# Patient Record
Sex: Female | Born: 1975 | Race: Asian | Hispanic: No | Marital: Married | Smoking: Never smoker
Health system: Southern US, Community
[De-identification: ages and names within clinical notes are randomized; demographics above are authoritative.]

## PROBLEM LIST (undated history)

## (undated) HISTORY — PX: OTHER SURGICAL HISTORY: SHX169

---

## 2006-03-03 HISTORY — PX: BREAST BIOPSY: SHX20

## 2006-03-03 HISTORY — PX: BREAST CYST ASPIRATION: SHX578

## 2017-08-04 ENCOUNTER — Ambulatory Visit: Payer: 59 | Admitting: Internal Medicine

## 2017-08-14 ENCOUNTER — Encounter: Payer: Self-pay | Admitting: Family

## 2017-08-14 ENCOUNTER — Ambulatory Visit: Payer: 59 | Admitting: Family

## 2017-08-14 VITALS — BP 118/78 | HR 74 | Temp 98.8°F | Ht 66.5 in | Wt 170.1 lb

## 2017-08-14 DIAGNOSIS — Z8639 Personal history of other endocrine, nutritional and metabolic disease: Secondary | ICD-10-CM | POA: Insufficient documentation

## 2017-08-14 DIAGNOSIS — Z0000 Encounter for general adult medical examination without abnormal findings: Secondary | ICD-10-CM

## 2017-08-14 DIAGNOSIS — R319 Hematuria, unspecified: Secondary | ICD-10-CM | POA: Insufficient documentation

## 2017-08-14 DIAGNOSIS — L989 Disorder of the skin and subcutaneous tissue, unspecified: Secondary | ICD-10-CM | POA: Insufficient documentation

## 2017-08-14 DIAGNOSIS — R5383 Other fatigue: Secondary | ICD-10-CM | POA: Insufficient documentation

## 2017-08-14 DIAGNOSIS — E611 Iron deficiency: Secondary | ICD-10-CM

## 2017-08-14 NOTE — Assessment & Plan Note (Signed)
Patient declines clinical breast exam, pelvic exam today.  She would like to establish with OB/GYN .Marland Kitchen.  Referral has been placed.  She is due for mammogram and that is been ordered.  She politely declines a diagnostic mammogram in the context of a known left cyst.  A screening  mammogram has been ordered and patient understands to schedule.  No early family history for colon cancer.

## 2017-08-14 NOTE — Assessment & Plan Note (Signed)
Pending iron studies.  

## 2017-08-14 NOTE — Assessment & Plan Note (Signed)
Does not appear infected. Referral placed.

## 2017-08-14 NOTE — Patient Instructions (Addendum)
Labs today  Today we discussed referrals, orders. OB GYN, General Surgery   I have placed these orders in the system for you.  Please be sure to give [place] a call if you have not heard from our office regarding scheduling a test or regarding referral in a timely manner.  It is very important that you let me know as soon as possible.      We placed a referral for mammogram this year. I asked that you call one the below locations and schedule this when it is convenient for you.   As discussed, I would like you to ask for 3D mammogram over the traditional 2D mammogram as new evidence suggest 3D is superior.    [place]  [place], [place] 3D mammogram if you ask*   Health Maintenance, Female Adopting a healthy lifestyle and getting preventive care can go a long way to promote health and wellness. Talk with your health care provider about what schedule of regular examinations is right for you. This is a good chance for you to check in with your provider about disease prevention and staying healthy. In between checkups, there are plenty of things you can do on your own. Experts have done a lot of research about which lifestyle changes and preventive measures are most likely to keep you healthy. Ask your health care provider for more information. Weight and diet Eat a healthy diet  Be sure to include plenty of vegetables, fruits, low-fat dairy products, and lean protein.  Do not eat a lot of foods high in solid fats, added sugars, or salt.  Get regular exercise. This is one of the most important things you can do for your health. ? Most adults should exercise for at least 150 minutes each week. The exercise should increase your heart rate and make you sweat (moderate-intensity exercise). ? Most adults should also do strengthening exercises at least twice a week. This is in addition to the moderate-intensity exercise.  Maintain a  healthy weight  Body mass index (BMI) is a measurement that can be used to identify possible weight problems. It estimates body fat based on height and weight. Your health care provider can help determine your BMI and help you achieve or maintain a healthy weight.  For females 30 years of age and older: ? A BMI below 18.5 is considered underweight. ? A BMI of 18.5 to 24.9 is normal. ? A BMI of 25 to 29.9 is considered overweight. ? A BMI of 30 and above is considered obese.  Watch levels of cholesterol and blood lipids  You should start having your blood tested for lipids and cholesterol at 40 years of age, then have this test every 5 years.  You may need to have your cholesterol levels checked more often if: ? Your lipid or cholesterol levels are high. ? You are older than 40 years of age. ? You are at high risk for heart disease.  Cancer screening Lung Cancer  Lung cancer screening is recommended for adults 40-51 years old who are at high risk for lung cancer because of a history of smoking.  A yearly low-dose CT scan of the lungs is recommended for people who: ? Currently smoke. ? Have quit within the past 15 years. ? Have at least a 30-pack-year history of smoking. A pack year is smoking an average of one pack of cigarettes a day for 1 year.  Yearly  screening should continue until it has been 15 years since you quit.  Yearly screening should stop if you develop a health problem that would prevent you from having lung cancer treatment.  Breast Cancer  Practice breast self-awareness. This means understanding how your breasts normally appear and feel.  It also means doing regular breast self-exams. Let your health care provider know about any changes, no matter how small.  If you are in your 40s or 30s, you should have a clinical breast exam (CBE) by a health care provider every 1-3 years as part of a regular health exam.  If you are 40 or older, have a CBE every year. Also  consider having a breast X-ray (mammogram) every year.  If you have a family history of breast cancer, talk to your health care provider about genetic screening.  If you are at high risk for breast cancer, talk to your health care provider about having an MRI and a mammogram every year.  Breast cancer gene (BRCA) assessment is recommended for women who have family members with BRCA-related cancers. BRCA-related cancers include: ? Breast. ? Ovarian. ? Tubal. ? Peritoneal cancers.  Results of the assessment will determine the need for genetic counseling and BRCA1 and BRCA2 testing.  Cervical Cancer Your health care provider may recommend that you be screened regularly for cancer of the pelvic organs (ovaries, uterus, and vagina). This screening involves a pelvic examination, including checking for microscopic changes to the surface of your cervix (Pap test). You may be encouraged to have this screening done every 3 years, beginning at age 40.  For women ages 40-65, health care providers may recommend pelvic exams and Pap testing every 3 years, or they may recommend the Pap and pelvic exam, combined with testing for human papilloma virus (HPV), every 5 years. Some types of HPV increase your risk of cervical cancer. Testing for HPV may also be done on women of any age with unclear Pap test results.  Other health care providers may not recommend any screening for nonpregnant women who are considered low risk for pelvic cancer and who do not have symptoms. Ask your health care provider if a screening pelvic exam is right for you.  If you have had past treatment for cervical cancer or a condition that could lead to cancer, you need Pap tests and screening for cancer for at least 40 years after your treatment. If Pap tests have been discontinued, your risk factors (such as having a new sexual partner) need to be reassessed to determine if screening should resume. Some women have medical problems that  increase the chance of getting cervical cancer. In these cases, your health care provider may recommend more frequent screening and Pap tests.  Colorectal Cancer  This type of cancer can be detected and often prevented.  Routine colorectal cancer screening usually begins at 40 years of age and continues through 40 years of age.  Your health care provider may recommend screening at an earlier age if you have risk factors for colon cancer.  Your health care provider may also recommend using home test kits to check for hidden blood in the stool.  A small camera at the end of a tube can be used to examine your colon directly (sigmoidoscopy or colonoscopy). This is done to check for the earliest forms of colorectal cancer.  Routine screening usually begins at age 45.  Direct examination of the colon should be repeated every 5-10 years through 40 years of age. However,  you may need to be screened more often if early forms of precancerous polyps or small growths are found.  Skin Cancer  Check your skin from head to toe regularly.  Tell your health care provider about any new moles or changes in moles, especially if there is a change in a mole's shape or color.  Also tell your health care provider if you have a mole that is larger than the size of a pencil eraser.  Always use sunscreen. Apply sunscreen liberally and repeatedly throughout the day.  Protect yourself by wearing long sleeves, pants, a wide-brimmed hat, and sunglasses whenever you are outside.  Heart disease, diabetes, and high blood pressure  High blood pressure causes heart disease and increases the risk of stroke. High blood pressure is more likely to develop in: ? People who have blood pressure in the high end of the normal range (130-139/85-89 mm Hg). ? People who are overweight or obese. ? People who are African American.  If you are 49-50 years of age, have your blood pressure checked every 3-5 years. If you are 62  years of age or older, have your blood pressure checked every year. You should have your blood pressure measured twice-once when you are at a hospital or clinic, and once when you are not at a hospital or clinic. Record the average of the two measurements. To check your blood pressure when you are not at a hospital or clinic, you can use: ? An automated blood pressure machine at a pharmacy. ? A home blood pressure monitor.  If you are between 61 years and 87 years old, ask your health care provider if you should take aspirin to prevent strokes.  Have regular diabetes screenings. This involves taking a blood sample to check your fasting blood sugar level. ? If you are at a normal weight and have a low risk for diabetes, have this test once every three years after 40 years of age. ? If you are overweight and have a high risk for diabetes, consider being tested at a younger age or more often. Preventing infection Hepatitis B  If you have a higher risk for hepatitis B, you should be screened for this virus. You are considered at high risk for hepatitis B if: ? You were born in a country where hepatitis B is common. Ask your health care provider which countries are considered high risk. ? Your parents were born in a high-risk country, and you have not been immunized against hepatitis B (hepatitis B vaccine). ? You have HIV or AIDS. ? You use needles to inject street drugs. ? You live with someone who has hepatitis B. ? You have had sex with someone who has hepatitis B. ? You get hemodialysis treatment. ? You take certain medicines for conditions, including cancer, organ transplantation, and autoimmune conditions.  Hepatitis C  Blood testing is recommended for: ? Everyone born from [birth year] through 1963. ? Anyone with known risk factors for hepatitis C.  Sexually transmitted infections (STIs)  You should be screened for sexually transmitted infections (STIs) including gonorrhea and chlamydia  if: ? You are sexually active and are younger than 40 years of age. ? You are older than 40 years of age and your health care provider tells you that you are at risk for this type of infection. ? Your sexual activity has changed since you were last screened and you are at an increased risk for chlamydia or gonorrhea. Ask your health care provider if you  are at risk.  If you do not have HIV, but are at risk, it may be recommended that you take a prescription medicine daily to prevent HIV infection. This is called pre-exposure prophylaxis (PrEP). You are considered at risk if: ? You are sexually active and do not regularly use condoms or know the HIV status of your partner(s). ? You take drugs by injection. ? You are sexually active with a partner who has HIV.  Talk with your health care provider about whether you are at high risk of being infected with HIV. If you choose to begin PrEP, you should first be tested for HIV. You should then be tested every 3 months for as long as you are taking PrEP. Pregnancy  If you are premenopausal and you may become pregnant, ask your health care provider about preconception counseling.  If you may become pregnant, take 400 to 800 micrograms (mcg) of folic acid every day.  If you want to prevent pregnancy, talk to your health care provider about birth control (contraception). Osteoporosis and menopause  Osteoporosis is a disease in which the bones lose minerals and strength with aging. This can result in serious bone fractures. Your risk for osteoporosis can be identified using a bone density scan.  If you are 46 years of age or older, or if you are at risk for osteoporosis and fractures, ask your health care provider if you should be screened.  Ask your health care provider whether you should take a calcium or vitamin D supplement to lower your risk for osteoporosis.  Menopause may have certain physical symptoms and risks.  Hormone replacement therapy  may reduce some of these symptoms and risks. Talk to your health care provider about whether hormone replacement therapy is right for you. Follow these instructions at home:  Schedule regular health, dental, and eye exams.  Stay current with your immunizations.  Do not use any tobacco products including cigarettes, chewing tobacco, or electronic cigarettes.  If you are pregnant, do not drink alcohol.  If you are breastfeeding, limit how much and how often you drink alcohol.  Limit alcohol intake to no more than 1 drink per day for nonpregnant women. One drink equals 12 ounces of beer, 5 ounces of wine, or 1 ounces of hard liquor.  Do not use street drugs.  Do not share needles.  Ask your health care provider for help if you need support or information about quitting drugs.  Tell your health care provider if you often feel depressed.  Tell your health care provider if you have ever been abused or do not feel safe at home. This information is not intended to replace advice given to you by your health care provider. Make sure you discuss any questions you have with your health care provider. Document Released: 09/02/2010 Document Revised: 07/26/2015 Document Reviewed: 11/21/2014 Elsevier Interactive Patient Education  [employer].

## 2017-08-14 NOTE — Progress Notes (Signed)
Subjective:    Patient ID: Nicole Hunter, female    DOB: 17-Feb-1976, 42 y.o.   MRN: [id number]  CC: Nicole PatienceZhou Pavao is a 42 y.o. female who presents today to establish care and for CPE.  HPI: Complains of neck pain for years, intermittent. Worsened with computer use of late.  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  Interested in PT. Takes tyleonol for pain with resolve.  No numbness, tingling, vision change  H/o low Iron and would like to have iron levels checked  She would also like to establish with a GYN since moving here.  She does note that her periods have gotten heavier.  No dizziness, syncope.  She also has a history of hematuria.  She is overdue to follow-up on urinalysis.  She would like this done today.    She also notes on her right pointer finger that she poked a fish oil while cooking couple weeks ago.  Has some pain there.  Only slight improvement with warm compresses.  No purulent discharge, erythema.  Feels like something may be stuck in it.  Would like a referral to see Dr. Doristine CounterBurnett, general surgeon  H/o known Left breast cyst per patient  Like a screening mammogram.  No skin concerns . No h/o skin cancer       HISTORY:  History reviewed. No pertinent past medical history. Past Surgical History:  Procedure Laterality Date  . left breast cyst Left   . right breast biopsy Right    Family History  Problem Relation Age of Onset  . Colon cancer Neg Hx     Allergies: Patient has no known allergies. Current Outpatient Medications on File Prior to Visit  Medication Sig Dispense Refill  . Cholecalciferol (VITAMIN D3) 5000 units CAPS Take by mouth daily.      No current facility-administered medications on file prior to visit.     Social History   Tobacco Use  . Smoking status: Never Smoker  . Smokeless tobacco: Never Used  Substance Use Topics  . Alcohol use: Yes  . Drug use: Never    Review of Systems  Constitutional: Negative for chills, fever and  unexpected weight change.  HENT: Negative for congestion.   Respiratory: Negative for cough.   Cardiovascular: Negative for chest pain, palpitations and leg swelling.  Gastrointestinal: Negative for abdominal pain, nausea and vomiting.  Genitourinary: Positive for hematuria (chronic, microscopic). Negative for dysuria.  Musculoskeletal: Negative for arthralgias and myalgias.  Skin: Negative for rash.  Neurological: Negative for headaches.  Hematological: Negative for adenopathy.  Psychiatric/Behavioral: Negative for confusion.      Objective:    BP 118/78 (BP Location: Left Arm, Patient Position: Sitting, Cuff Size: Normal)   Pulse 74   Temp 98.8 F (37.1 C) (Oral)   Ht 5' 6.5" (1.689 m)   Wt 170 lb 2 oz (77.2 kg)   SpO2 97%   BMI 27.05 kg/m  BP Readings from Last 3 Encounters:  08/14/17 118/78   Wt Readings from Last 3 Encounters:  08/14/17 170 lb 2 oz (77.2 kg)    Physical Exam  Constitutional: She appears well-developed and well-nourished.  Eyes: Conjunctivae are normal.  Neck: No thyroid mass and no thyromegaly present.  Cardiovascular: Normal rate, regular rhythm, normal heart sounds and normal pulses.  Pulmonary/Chest: Effort normal and breath sounds normal. She has no wheezes. She has no rhonchi. She has no rales.  Lymphadenopathy:       Head (right side): No submental,  no submandibular, no tonsillar, no preauricular, no posterior auricular and no occipital adenopathy present.       Head (left side): No submental, no submandibular, no tonsillar, no preauricular, no posterior auricular and no occipital adenopathy present.    She has no cervical adenopathy.  Neurological: She is alert.  Skin: Skin is warm and dry.  Psychiatric: She has a normal mood and affect. Her speech is normal and behavior is normal. Thought content normal.  Vitals reviewed.      Assessment & Plan:   Problem List Items Addressed This Visit      Musculoskeletal and Integument   Skin  lesion    Does not appear infected. Referral placed.       Relevant Orders   Ambulatory referral to General Surgery     Other   Routine physical examination - Primary    Patient declines clinical breast exam, pelvic exam today.  She would like to establish with OB/GYN .Marland Kitchen  Referral has been placed.  She is due for mammogram and that is been ordered.  She politely declines a diagnostic mammogram in the context of a known left cyst.  A screening  mammogram has been ordered and patient understands to schedule.  No early family history for colon cancer.      Relevant Orders   Ambulatory referral to Obstetrics / Gynecology   CBC with Differential/Platelet   Comprehensive metabolic panel   Lipid panel   TSH   VITAMIN D 25 Hydroxy (Vit-D Deficiency, Fractures)   Urine Microscopic   Hemoglobin A1c   HIV antibody   MM 3D SCREEN BREAST BILATERAL   Iron, TIBC and Ferritin Panel   History of vitamin D deficiency    Pending vitamin d      Relevant Medications   Cholecalciferol (VITAMIN D3) 5000 units CAPS   Other Relevant Orders   VITAMIN D 25 Hydroxy (Vit-D Deficiency, Fractures)   Fatigue    Pending tsh.       Iron deficiency    Pending iron studies      Relevant Orders   CBC with Differential/Platelet   Iron, TIBC and Ferritin Panel   Hematuria    Pending ua      Relevant Orders   Urine Microscopic       I am having Tynlee Bayle maintain her Vitamin D3.   No orders of the defined types were placed in this encounter.   Return precautions given.   Risks, benefits, and alternatives of the medications and treatment plan prescribed today were discussed, and patient expressed understanding.   Education regarding symptom management and diagnosis given to patient on AVS.  Continue to follow with Allegra Grana, FNP for routine health maintenance.   Nicole Patience and I agreed with plan.   Rennie Plowman, FNP

## 2017-08-14 NOTE — Assessment & Plan Note (Signed)
Pending ua

## 2017-08-14 NOTE — Assessment & Plan Note (Signed)
Pending vitamin d 

## 2017-08-14 NOTE — Assessment & Plan Note (Signed)
Pending tsh 

## 2017-08-15 LAB — CBC WITH DIFFERENTIAL/PLATELET
BASOS PCT: 0.5 %
Basophils Absolute: 40 cells/uL (ref 0–200)
EOS PCT: 4.5 %
Eosinophils Absolute: 360 cells/uL (ref 15–500)
HCT: 42.7 % (ref 35.0–45.0)
Hemoglobin: 14.7 g/dL (ref 11.7–15.5)
Lymphs Abs: 2248 cells/uL (ref 850–3900)
MCH: 29.9 pg (ref 27.0–33.0)
MCHC: 34.4 g/dL (ref 32.0–36.0)
MCV: 86.8 fL (ref 80.0–100.0)
MONOS PCT: 5.9 %
MPV: 10.8 fL (ref 7.5–12.5)
Neutro Abs: 4880 cells/uL (ref 1500–7800)
Neutrophils Relative %: 61 %
PLATELETS: 262 10*3/uL (ref 140–400)
RBC: 4.92 10*6/uL (ref 3.80–5.10)
RDW: 12 % (ref 11.0–15.0)
TOTAL LYMPHOCYTE: 28.1 %
WBC: 8 10*3/uL (ref 3.8–10.8)
WBCMIX: 472 {cells}/uL (ref 200–950)

## 2017-08-15 LAB — IRON,TIBC AND FERRITIN PANEL
%SAT: 36 % (ref 11–50)
Ferritin: 57 ng/mL (ref 10–154)
Iron: 125 ug/dL (ref 40–190)
TIBC: 344 mcg/dL (calc) (ref 250–450)

## 2017-08-15 LAB — COMPREHENSIVE METABOLIC PANEL
AG Ratio: 1.8 (calc) (ref 1.0–2.5)
ALBUMIN MSPROF: 4.6 g/dL (ref 3.6–5.1)
ALT: 10 U/L (ref 6–29)
AST: 12 U/L (ref 10–30)
Alkaline phosphatase (APISO): 48 U/L (ref 33–115)
BUN: 10 mg/dL (ref 7–25)
CHLORIDE: 104 mmol/L (ref 98–110)
CO2: 23 mmol/L (ref 20–32)
Calcium: 9.6 mg/dL (ref 8.6–10.2)
Creat: 0.77 mg/dL (ref 0.50–1.10)
GLOBULIN: 2.5 g/dL (ref 1.9–3.7)
GLUCOSE: 90 mg/dL (ref 65–99)
POTASSIUM: 4.1 mmol/L (ref 3.5–5.3)
SODIUM: 139 mmol/L (ref 135–146)
TOTAL PROTEIN: 7.1 g/dL (ref 6.1–8.1)
Total Bilirubin: 1 mg/dL (ref 0.2–1.2)

## 2017-08-15 LAB — VITAMIN D 25 HYDROXY (VIT D DEFICIENCY, FRACTURES): VIT D 25 HYDROXY: 33 ng/mL (ref 30–100)

## 2017-08-15 LAB — HEMOGLOBIN A1C
EAG (MMOL/L): 4.9 (calc)
Hgb A1c MFr Bld: 4.7 % of total Hgb (ref <5.7)
MEAN PLASMA GLUCOSE: 88 (calc)

## 2017-08-15 LAB — LIPID PANEL
CHOL/HDL RATIO: 4.1 (calc) (ref <5.0)
Cholesterol: 227 mg/dL — ABNORMAL HIGH (ref <200)
HDL: 56 mg/dL (ref >50)
LDL CHOLESTEROL (CALC): 152 mg/dL — AB
NON-HDL CHOLESTEROL (CALC): 171 mg/dL — AB (ref <130)
Triglycerides: 86 mg/dL (ref <150)

## 2017-08-15 LAB — TSH: TSH: 3.25 mIU/L

## 2017-08-15 LAB — URINALYSIS, MICROSCOPIC ONLY: Hyaline Cast: NONE SEEN /LPF

## 2017-08-15 LAB — HIV ANTIBODY (ROUTINE TESTING W REFLEX): HIV: NONREACTIVE

## 2017-08-17 ENCOUNTER — Encounter: Payer: Self-pay | Admitting: Family

## 2017-08-17 ENCOUNTER — Other Ambulatory Visit: Payer: Self-pay | Admitting: Family

## 2017-08-17 DIAGNOSIS — R319 Hematuria, unspecified: Secondary | ICD-10-CM

## 2017-08-17 NOTE — Progress Notes (Signed)
close

## 2017-08-19 NOTE — Telephone Encounter (Signed)
Address updated in system

## 2017-08-25 ENCOUNTER — Other Ambulatory Visit: Payer: 59

## 2017-09-01 ENCOUNTER — Ambulatory Visit: Payer: 59 | Admitting: Internal Medicine

## 2017-09-08 ENCOUNTER — Ambulatory Visit
Admission: RE | Admit: 2017-09-08 | Discharge: 2017-09-08 | Disposition: A | Discharge status: Home / Self Care | Payer: 59 | Source: Ambulatory Visit | Attending: Family | Admitting: Family

## 2017-09-08 DIAGNOSIS — R319 Hematuria, unspecified: Secondary | ICD-10-CM | POA: Diagnosis not present

## 2017-09-09 ENCOUNTER — Encounter: Payer: Self-pay | Admitting: Family

## 2017-09-10 ENCOUNTER — Encounter: Payer: Self-pay | Admitting: Family

## 2017-09-14 DIAGNOSIS — R319 Hematuria, unspecified: Secondary | ICD-10-CM | POA: Diagnosis not present

## 2017-09-15 ENCOUNTER — Encounter: Payer: Self-pay | Admitting: Family

## 2017-11-09 ENCOUNTER — Encounter: Payer: Self-pay | Admitting: Family

## 2017-11-17 ENCOUNTER — Other Ambulatory Visit: Payer: Self-pay | Admitting: *Deleted

## 2017-11-17 ENCOUNTER — Inpatient Hospital Stay
Admission: RE | Admit: 2017-11-17 | Discharge: 2017-11-17 | Disposition: A | Discharge status: Home / Self Care | Payer: Self-pay | Source: Ambulatory Visit | Attending: *Deleted | Admitting: *Deleted

## 2017-11-17 DIAGNOSIS — Z9289 Personal history of other medical treatment: Secondary | ICD-10-CM

## 2017-11-25 ENCOUNTER — Encounter: Payer: Self-pay | Admitting: Radiology

## 2017-11-25 ENCOUNTER — Ambulatory Visit
Admission: RE | Admit: 2017-11-25 | Discharge: 2017-11-25 | Disposition: A | Discharge status: Home / Self Care | Payer: 59 | Source: Ambulatory Visit | Attending: Family | Admitting: Family

## 2017-11-25 DIAGNOSIS — Z0000 Encounter for general adult medical examination without abnormal findings: Secondary | ICD-10-CM | POA: Insufficient documentation

## 2017-11-25 DIAGNOSIS — Z1231 Encounter for screening mammogram for malignant neoplasm of breast: Secondary | ICD-10-CM | POA: Diagnosis not present

## 2017-11-27 ENCOUNTER — Encounter: Payer: Self-pay | Admitting: Family

## 2018-01-05 DIAGNOSIS — N393 Stress incontinence (female) (male): Secondary | ICD-10-CM | POA: Diagnosis not present

## 2018-01-05 DIAGNOSIS — Z01419 Encounter for gynecological examination (general) (routine) without abnormal findings: Secondary | ICD-10-CM | POA: Diagnosis not present

## 2018-01-07 ENCOUNTER — Encounter: Payer: Self-pay | Admitting: Family

## 2018-01-11 ENCOUNTER — Other Ambulatory Visit: Payer: Self-pay | Admitting: Family

## 2018-01-11 DIAGNOSIS — J309 Allergic rhinitis, unspecified: Secondary | ICD-10-CM

## 2018-01-11 MED ORDER — AZELASTINE HCL 0.1 % NA SOLN: 1.0000 | Freq: Two times a day (BID) | NASAL | 6 refills | Status: DC

## 2018-01-19 DIAGNOSIS — H5213 Myopia, bilateral: Secondary | ICD-10-CM | POA: Diagnosis not present

## 2018-05-06 ENCOUNTER — Encounter: Payer: Self-pay | Admitting: Family

## 2018-05-07 ENCOUNTER — Telehealth: Payer: Self-pay

## 2018-05-07 NOTE — Telephone Encounter (Signed)
I called patient & advised appointment based on mychart message. I tried to get her in with Lauren & even offered appointment on Monday. Patient declined for now due to busy schedule. I explained that antibiotics do not get prescribed without being seen.

## 2018-08-27 ENCOUNTER — Encounter: Payer: 59 | Admitting: Family

## 2018-09-06 ENCOUNTER — Encounter: Payer: 59 | Admitting: Family

## 2018-09-06 ENCOUNTER — Ambulatory Visit: Payer: 59 | Admitting: Family

## 2018-09-08 ENCOUNTER — Encounter: Payer: 59 | Admitting: Family

## 2018-09-24 ENCOUNTER — Encounter: Payer: Self-pay | Admitting: Family

## 2018-09-24 ENCOUNTER — Ambulatory Visit (INDEPENDENT_AMBULATORY_CARE_PROVIDER_SITE_OTHER): Payer: 59 | Admitting: Family

## 2018-09-24 ENCOUNTER — Other Ambulatory Visit: Payer: Self-pay

## 2018-09-24 VITALS — BP 100/68 | HR 73 | Temp 98.8°F | Ht 66.25 in | Wt 172.1 lb

## 2018-09-24 DIAGNOSIS — J309 Allergic rhinitis, unspecified: Secondary | ICD-10-CM | POA: Diagnosis not present

## 2018-09-24 DIAGNOSIS — Z0000 Encounter for general adult medical examination without abnormal findings: Secondary | ICD-10-CM | POA: Diagnosis not present

## 2018-09-24 DIAGNOSIS — E611 Iron deficiency: Secondary | ICD-10-CM

## 2018-09-24 DIAGNOSIS — R319 Hematuria, unspecified: Secondary | ICD-10-CM | POA: Diagnosis not present

## 2018-09-24 MED ORDER — AZELASTINE HCL 0.1 % NA SOLN: 1.0000 | Freq: Two times a day (BID) | NASAL | 6 refills | Status: DC

## 2018-09-24 NOTE — Assessment & Plan Note (Signed)
History of. Pending iron studies.

## 2018-09-24 NOTE — Assessment & Plan Note (Addendum)
Clinical breast exam performed.  Known history of left breast cyst, on exam today could feel a palpable area as well right breast approximately 9:00.  Jointly agreed to pursue diagnostic mammogram, ultrasound.  Given instructions to patient for her to schedule.  Deferred pelvic exam as patient has a GYN she is aware that her Pap smear is due.

## 2018-09-24 NOTE — Patient Instructions (Addendum)
Always a pleasure to see you!  Today we discussed referrals, orders. Diagnostic mammogram   I have placed these orders in the system for you.  Please be sure to give [place] a call if you have not heard from our office regarding this. We should hear from [place] within ONE week with information regarding your appointment. If not, please let me know immediately.     Health Maintenance, Female Adopting a healthy lifestyle and getting preventive care are important in promoting health and wellness. Ask your health care provider about:  The right schedule for you to have regular tests and exams.  Things you can do on your own to prevent diseases and keep yourself healthy. What should I know about diet, weight, and exercise? Eat a healthy diet   Eat a diet that includes plenty of vegetables, fruits, low-fat dairy products, and lean protein.  Do not eat a lot of foods that are high in solid fats, added sugars, or sodium. Maintain a healthy weight Body mass index (BMI) is used to identify weight problems. It estimates body fat based on height and weight. Your health care provider can help determine your BMI and help you achieve or maintain a healthy weight. Get regular exercise Get regular exercise. This is one of the most important things you can do for your health. Most adults should:  Exercise for at least 150 minutes each week. The exercise should increase your heart rate and make you sweat (moderate-intensity exercise).  Do strengthening exercises at least twice a week. This is in addition to the moderate-intensity exercise.  Spend less time sitting. Even light physical activity can be beneficial. Watch cholesterol and blood lipids Have your blood tested for lipids and cholesterol at 41 years of age, then have this test every 5 years. Have your cholesterol levels checked more often if:  Your lipid or cholesterol levels are high.  You are older than 41 years of age.  You are at high risk for  heart disease. What should I know about cancer screening? Depending on your health history and family history, you may need to have cancer screening at various ages. This may include screening for:  Breast cancer.  Cervical cancer.  Colorectal cancer.  Skin cancer.  Lung cancer. What should I know about heart disease, diabetes, and high blood pressure? Blood pressure and heart disease  High blood pressure causes heart disease and increases the risk of stroke. This is more likely to develop in people who have high blood pressure readings, are of African descent, or are overweight.  Have your blood pressure checked: ? Every 3-5 years if you are 67-45 years of age. ? Every year if you are 15 years old or older. Diabetes Have regular diabetes screenings. This checks your fasting blood sugar level. Have the screening done:  Once every three years after age 64 if you are at a normal weight and have a low risk for diabetes.  More often and at a younger age if you are overweight or have a high risk for diabetes. What should I know about preventing infection? Hepatitis B If you have a higher risk for hepatitis B, you should be screened for this virus. Talk with your health care provider to find out if you are at risk for hepatitis B infection. Hepatitis C Testing is recommended for:  Everyone born from [birth year] through 1965.  Anyone with known risk factors for hepatitis C. Sexually transmitted infections (STIs)  Get screened for STIs, including gonorrhea  and chlamydia, if: ? You are sexually active and are younger than 41 years of age. ? You are older than 41 years of age and your health care provider tells you that you are at risk for this type of infection. ? Your sexual activity has changed since you were last screened, and you are at increased risk for chlamydia or gonorrhea. Ask your health care provider if you are at risk.  Ask your health care provider about whether you are at  high risk for HIV. Your health care provider may recommend a prescription medicine to help prevent HIV infection. If you choose to take medicine to prevent HIV, you should first get tested for HIV. You should then be tested every 3 months for as long as you are taking the medicine. Pregnancy  If you are about to stop having your period (premenopausal) and you may become pregnant, seek counseling before you get pregnant.  Take 400 to 800 micrograms (mcg) of folic acid every day if you become pregnant.  Ask for birth control (contraception) if you want to prevent pregnancy. Osteoporosis and menopause Osteoporosis is a disease in which the bones lose minerals and strength with aging. This can result in bone fractures. If you are 41 years old or older, or if you are at risk for osteoporosis and fractures, ask your health care provider if you should:  Be screened for bone loss.  Take a calcium or vitamin D supplement to lower your risk of fractures.  Be given hormone replacement therapy (HRT) to treat symptoms of menopause. Follow these instructions at home: Lifestyle  Do not use any products that contain nicotine or tobacco, such as cigarettes, e-cigarettes, and chewing tobacco. If you need help quitting, ask your health care provider.  Do not use street drugs.  Do not share needles.  Ask your health care provider for help if you need support or information about quitting drugs. Alcohol use  Do not drink alcohol if: ? Your health care provider tells you not to drink. ? You are pregnant, may be pregnant, or are planning to become pregnant.  If you drink alcohol: ? Limit how much you use to 0-1 drink a day. ? Limit intake if you are breastfeeding.  Be aware of how much alcohol is in your drink. In the [place] one drink equals one 12 oz bottle of beer (355 mL), one 5 oz glass of wine (148 mL), or one 1 oz glass of hard liquor (44 mL). General instructions  Schedule regular health,  dental, and eye exams.  Stay current with your vaccines.  Tell your health care provider if: ? You often feel depressed. ? You have ever been abused or do not feel safe at home. Summary  Adopting a healthy lifestyle and getting preventive care are important in promoting health and wellness.  Follow your health care provider's instructions about healthy diet, exercising, and getting tested or screened for diseases.  Follow your health care provider's instructions on monitoring your cholesterol and blood pressure. This information is not intended to replace advice given to you by your health care provider. Make sure you discuss any questions you have with your health care provider. Document Released: 09/02/2010 Document Revised: 02/10/2018 Document Reviewed: 02/10/2018 Elsevier Patient Education  2020 [employer]

## 2018-09-24 NOTE — Progress Notes (Signed)
Subjective:    Patient ID: Nicole Hunter, female    DOB: 01/26/1976, 43 y.o.   MRN: [id number]  CC: Nicole Hunter is a 43 y.o. female who presents today for physical exam.    HPI: Feels well today.   A little fatigue however not profound. Notes sleeping well. No depression, anxiety.    h/o iron deficiency  H/o hematuria  Colorectal Cancer Screening: No early family history.  Breast Cancer Screening: Mammogram due 11/2018  Cervical Cancer Screening: establish with GYN, Dr Elesa MassedWard, last year.  Continues to have periods which are heavy. Would like TSH monitored for this. She will continue to follow with Dr Elesa MassedWard.   Bone Health screening/DEXA for 65+: No increased fracture risk. Defer screening at this time. Lung Cancer Screening: Doesn't have 30 year pack year history and age > 55 years       Tetanus - utd         Labs: Screening labs today. Exercise: Gets regular exercise.  Alcohol use: occasional Smoking/tobacco use: Nonsmoker.    HISTORY:  History reviewed. No pertinent past medical history.  Past Surgical History:  Procedure Laterality Date  . BREAST CYST ASPIRATION Right 2008  . left breast cyst Left   . right breast biopsy Right    Family History  Problem Relation Age of Onset  . Colon cancer Neg Hx   . Breast cancer Neg Hx       ALLERGIES: Patient has no known allergies.  Current Outpatient Medications on File Prior to Visit  Medication Sig Dispense Refill  . Cholecalciferol (VITAMIN D3) 5000 units CAPS Take by mouth daily.      No current facility-administered medications on file prior to visit.     Social History   Tobacco Use  . Smoking status: Never Smoker  . Smokeless tobacco: Never Used  Substance Use Topics  . Alcohol use: Yes  . Drug use: Never    Review of Systems  Constitutional: Negative for chills, fever and unexpected weight change.  HENT: Negative for congestion.   Respiratory: Negative for cough.   Cardiovascular: Negative for chest pain,  palpitations and leg swelling.  Gastrointestinal: Negative for nausea and vomiting.  Musculoskeletal: Negative for arthralgias and myalgias.  Skin: Negative for rash.  Neurological: Negative for headaches.  Hematological: Negative for adenopathy.  Psychiatric/Behavioral: Negative for confusion.      Objective:    BP 100/68   Pulse 73   Temp 98.8 F (37.1 C)   Ht 5' 6.25" (1.683 m)   Wt 172 lb 1.9 oz (78.1 kg)   SpO2 95%   BMI 27.57 kg/m   BP Readings from Last 3 Encounters:  09/24/18 100/68  08/14/17 118/78   Wt Readings from Last 3 Encounters:  09/24/18 172 lb 1.9 oz (78.1 kg)  08/14/17 170 lb 2 oz (77.2 kg)    Physical Exam Vitals signs reviewed.  Constitutional:      Appearance: She is well-developed.  Eyes:     Conjunctiva/sclera: Conjunctivae normal.  Neck:     Thyroid: No thyroid mass or thyromegaly.  Cardiovascular:     Rate and Rhythm: Normal rate and regular rhythm.     Pulses: Normal pulses.     Heart sounds: Normal heart sounds.  Pulmonary:     Effort: Pulmonary effort is normal.     Breath sounds: Normal breath sounds. No wheezing, rhonchi or rales.  Chest:     Breasts: Breasts are symmetrical.        Right:  Mass present. No inverted nipple, nipple discharge, skin change or tenderness.        Left: Mass present. No inverted nipple, nipple discharge, skin change or tenderness.       Comments: Non  Tender, non fluctuant  left breast mass 6 oclock;  Non  Tender, non fluctuant right breast mass 9 oclock Lymphadenopathy:     Head:     Right side of head: No submental, submandibular, tonsillar, preauricular, posterior auricular or occipital adenopathy.     Left side of head: No submental, submandibular, tonsillar, preauricular, posterior auricular or occipital adenopathy.     Cervical: No cervical adenopathy.     Right cervical: No superficial, deep or posterior cervical adenopathy.    Left cervical: No superficial, deep or posterior cervical  adenopathy.  Skin:    General: Skin is warm and dry.  Neurological:     Mental Status: She is alert.  Psychiatric:        Speech: Speech normal.        Behavior: Behavior normal.        Thought Content: Thought content normal.        Assessment & Plan:   Problem List Items Addressed This Visit      Other   Routine physical examination - Primary    Clinical breast exam performed.  Known history of left breast cyst, on exam today could feel a palpable area as well right breast approximately 9:00.  Jointly agreed to pursue diagnostic mammogram, ultrasound.  Given instructions to patient for her to schedule.  Deferred pelvic exam as patient has a GYN she is aware that her Pap smear is due.        Relevant Orders   TSH   CBC with Differential/Platelet   Comprehensive metabolic panel   Hemoglobin A1c   Lipid panel   VITAMIN D 25 Hydroxy (Vit-D Deficiency, Fractures)   MM DIAG BREAST TOMO BILATERAL   US BREAST LTD UNI LEFT INC AXILLA   US BREAST LTD UNI RIGHT INC AXILLA   Iron deficiency    History of. Pending iron studies.       Relevant Orders   Ferritin   Iron, TIBC and Ferritin Panel   Hematuria    Other Visit Diagnoses    Allergic rhinitis, unspecified seasonality, unspecified trigger       Relevant Medications   azelastine (ASTELIN) 0.1 % nasal spray       I am having Nicole Hunter maintain her Vitamin D3 and azelastine.   Meds ordered this encounter  Medications  . azelastine (ASTELIN) 0.1 % nasal spray    Sig: Place 1 spray into both nostrils 2 (two) times daily. Use in each nostril as directed    Dispense:  30 mL    Refill:  6    Order Specific Question:   Supervising Provider    Answer:   Crecencio Mc [[id number]]    Return precautions given.   Risks, benefits, and alternatives of the medications and treatment plan prescribed today were discussed, and patient expressed understanding.   Education regarding symptom management and diagnosis given to patient  on AVS.   Continue to follow with Burnard Hawthorne, FNP for routine health maintenance.   Nicole Hunter and I agreed with plan.   Mable Paris, FNP

## 2018-09-25 LAB — CBC WITH DIFFERENTIAL/PLATELET
Absolute Monocytes: 529 cells/uL (ref 200–950)
Basophils Absolute: 34 cells/uL (ref 0–200)
Basophils Relative: 0.4 %
Eosinophils Absolute: 176 cells/uL (ref 15–500)
Eosinophils Relative: 2.1 %
HCT: 41.4 % (ref 35.0–45.0)
Hemoglobin: 13.9 g/dL (ref 11.7–15.5)
Lymphs Abs: 1865 cells/uL (ref 850–3900)
MCH: 29.6 pg (ref 27.0–33.0)
MCHC: 33.6 g/dL (ref 32.0–36.0)
MCV: 88.3 fL (ref 80.0–100.0)
MPV: 10.9 fL (ref 7.5–12.5)
Monocytes Relative: 6.3 %
Neutro Abs: 5796 cells/uL (ref 1500–7800)
Neutrophils Relative %: 69 %
Platelets: 250 10*3/uL (ref 140–400)
RBC: 4.69 10*6/uL (ref 3.80–5.10)
RDW: 12.1 % (ref 11.0–15.0)
Total Lymphocyte: 22.2 %
WBC: 8.4 10*3/uL (ref 3.8–10.8)

## 2018-09-25 LAB — COMPREHENSIVE METABOLIC PANEL
AG Ratio: 1.9 (calc) (ref 1.0–2.5)
ALT: 9 U/L (ref 6–29)
AST: 10 U/L (ref 10–30)
Albumin: 4.4 g/dL (ref 3.6–5.1)
Alkaline phosphatase (APISO): 40 U/L (ref 31–125)
BUN: 15 mg/dL (ref 7–25)
CO2: 24 mmol/L (ref 20–32)
Calcium: 9.1 mg/dL (ref 8.6–10.2)
Chloride: 106 mmol/L (ref 98–110)
Creat: 0.89 mg/dL (ref 0.50–1.10)
Globulin: 2.3 g/dL (calc) (ref 1.9–3.7)
Glucose, Bld: 82 mg/dL (ref 65–99)
Potassium: 3.8 mmol/L (ref 3.5–5.3)
Sodium: 138 mmol/L (ref 135–146)
Total Bilirubin: 0.7 mg/dL (ref 0.2–1.2)
Total Protein: 6.7 g/dL (ref 6.1–8.1)

## 2018-09-25 LAB — LIPID PANEL
Cholesterol: 213 mg/dL — ABNORMAL HIGH (ref <200)
HDL: 55 mg/dL (ref >=50)
LDL Cholesterol (Calc): 138 mg/dL (calc) — ABNORMAL HIGH
Non-HDL Cholesterol (Calc): 158 mg/dL (calc) — ABNORMAL HIGH (ref <130)
Total CHOL/HDL Ratio: 3.9 (calc) (ref <5.0)
Triglycerides: 101 mg/dL (ref <150)

## 2018-09-25 LAB — HEMOGLOBIN A1C
Hgb A1c MFr Bld: 4.8 % of total Hgb (ref <5.7)
Mean Plasma Glucose: 91 (calc)
eAG (mmol/L): 5 (calc)

## 2018-09-25 LAB — TSH: TSH: 2.43 mIU/L

## 2018-09-25 LAB — SPECIMEN COMPROMISED

## 2018-09-25 LAB — VITAMIN D 25 HYDROXY (VIT D DEFICIENCY, FRACTURES): Vit D, 25-Hydroxy: 21 ng/mL — ABNORMAL LOW (ref 30–100)

## 2018-09-25 LAB — IRON,TIBC AND FERRITIN PANEL
%SAT: 35 % (calc) (ref 16–45)
Ferritin: 43 ng/mL (ref 16–154)
Iron: 119 ug/dL (ref 40–190)
TIBC: 342 mcg/dL (calc) (ref 250–450)

## 2018-10-07 ENCOUNTER — Ambulatory Visit
Admission: RE | Admit: 2018-10-07 | Discharge: 2018-10-07 | Disposition: A | Discharge status: Home / Self Care | Payer: 59 | Source: Ambulatory Visit | Attending: Family | Admitting: Family

## 2018-10-07 DIAGNOSIS — N6315 Unspecified lump in the right breast, overlapping quadrants: Secondary | ICD-10-CM | POA: Diagnosis not present

## 2018-10-07 DIAGNOSIS — Z0000 Encounter for general adult medical examination without abnormal findings: Secondary | ICD-10-CM

## 2018-10-07 DIAGNOSIS — N6313 Unspecified lump in the right breast, lower outer quadrant: Secondary | ICD-10-CM | POA: Diagnosis not present

## 2018-10-07 DIAGNOSIS — N6311 Unspecified lump in the right breast, upper outer quadrant: Secondary | ICD-10-CM | POA: Diagnosis not present

## 2018-10-07 DIAGNOSIS — N6002 Solitary cyst of left breast: Secondary | ICD-10-CM | POA: Insufficient documentation

## 2018-10-07 DIAGNOSIS — R922 Inconclusive mammogram: Secondary | ICD-10-CM | POA: Diagnosis not present

## 2018-10-08 ENCOUNTER — Other Ambulatory Visit: Payer: Self-pay | Admitting: Family

## 2018-10-08 DIAGNOSIS — N6310 Unspecified lump in the right breast, unspecified quadrant: Secondary | ICD-10-CM

## 2018-10-14 DIAGNOSIS — R809 Proteinuria, unspecified: Secondary | ICD-10-CM | POA: Diagnosis not present

## 2018-10-14 DIAGNOSIS — R3129 Other microscopic hematuria: Secondary | ICD-10-CM | POA: Diagnosis not present

## 2018-10-18 DIAGNOSIS — R3129 Other microscopic hematuria: Secondary | ICD-10-CM | POA: Diagnosis not present

## 2018-10-18 DIAGNOSIS — R809 Proteinuria, unspecified: Secondary | ICD-10-CM | POA: Diagnosis not present

## 2019-04-14 ENCOUNTER — Ambulatory Visit
Admission: RE | Admit: 2019-04-14 | Discharge: 2019-04-14 | Disposition: A | Discharge status: Home / Self Care | Payer: 59 | Source: Ambulatory Visit | Attending: Family | Admitting: Family

## 2019-04-14 DIAGNOSIS — N6311 Unspecified lump in the right breast, upper outer quadrant: Secondary | ICD-10-CM | POA: Diagnosis not present

## 2019-04-14 DIAGNOSIS — N6310 Unspecified lump in the right breast, unspecified quadrant: Secondary | ICD-10-CM | POA: Diagnosis not present

## 2019-04-14 DIAGNOSIS — N6313 Unspecified lump in the right breast, lower outer quadrant: Secondary | ICD-10-CM | POA: Diagnosis not present

## 2019-04-16 ENCOUNTER — Other Ambulatory Visit: Payer: Self-pay | Admitting: Family

## 2019-04-16 DIAGNOSIS — R928 Other abnormal and inconclusive findings on diagnostic imaging of breast: Secondary | ICD-10-CM

## 2019-04-20 ENCOUNTER — Other Ambulatory Visit: Payer: Self-pay | Admitting: Nephrology

## 2019-04-20 DIAGNOSIS — R319 Hematuria, unspecified: Secondary | ICD-10-CM

## 2019-04-28 ENCOUNTER — Other Ambulatory Visit: Payer: Self-pay | Admitting: Internal Medicine

## 2019-04-28 ENCOUNTER — Other Ambulatory Visit
Admission: RE | Admit: 2019-04-28 | Discharge: 2019-04-28 | Disposition: A | Discharge status: Home / Self Care | Payer: 59 | Source: Ambulatory Visit | Attending: Nephrology | Admitting: Nephrology

## 2019-04-28 DIAGNOSIS — R319 Hematuria, unspecified: Secondary | ICD-10-CM | POA: Diagnosis not present

## 2019-04-28 LAB — RENAL FUNCTION PANEL
Albumin: 4.7 g/dL (ref 3.5–5.0)
Anion gap: 9 (ref 5–15)
BUN: 10 mg/dL (ref 6–20)
CO2: 21 mmol/L — ABNORMAL LOW (ref 22–32)
Calcium: 9.3 mg/dL (ref 8.9–10.3)
Chloride: 106 mmol/L (ref 98–111)
Creatinine, Ser: 0.7 mg/dL (ref 0.44–1.00)
GFR calc Af Amer: >60 mL/min (ref >60)
GFR calc non Af Amer: >60 mL/min (ref >60)
Glucose, Bld: 98 mg/dL (ref 70–99)
Phosphorus: 3 mg/dL (ref 2.5–4.6)
Potassium: 4.2 mmol/L (ref 3.5–5.1)
Sodium: 136 mmol/L (ref 135–145)

## 2019-04-28 LAB — MAGNESIUM: Magnesium: 2.1 mg/dL (ref 1.7–2.4)

## 2019-10-03 ENCOUNTER — Other Ambulatory Visit: Payer: Self-pay

## 2019-10-03 ENCOUNTER — Ambulatory Visit (INDEPENDENT_AMBULATORY_CARE_PROVIDER_SITE_OTHER): Payer: 59 | Admitting: Family

## 2019-10-03 ENCOUNTER — Encounter: Payer: Self-pay | Admitting: Family

## 2019-10-03 VITALS — BP 116/82 | HR 71 | Temp 97.8°F | Ht 66.26 in | Wt 177.8 lb

## 2019-10-03 DIAGNOSIS — E611 Iron deficiency: Secondary | ICD-10-CM

## 2019-10-03 DIAGNOSIS — Z8639 Personal history of other endocrine, nutritional and metabolic disease: Secondary | ICD-10-CM | POA: Diagnosis not present

## 2019-10-03 DIAGNOSIS — R319 Hematuria, unspecified: Secondary | ICD-10-CM

## 2019-10-03 DIAGNOSIS — Z136 Encounter for screening for cardiovascular disorders: Secondary | ICD-10-CM | POA: Diagnosis not present

## 2019-10-03 DIAGNOSIS — Z0000 Encounter for general adult medical examination without abnormal findings: Secondary | ICD-10-CM

## 2019-10-03 DIAGNOSIS — Z13220 Encounter for screening for lipoid disorders: Secondary | ICD-10-CM

## 2019-10-03 NOTE — Patient Instructions (Signed)
( declines AVS printed)  Always nice to see you!   Health Maintenance, Female Adopting a healthy lifestyle and getting preventive care are important in promoting health and wellness. Ask your health care provider about:  The right schedule for you to have regular tests and exams.  Things you can do on your own to prevent diseases and keep yourself healthy. What should I know about diet, weight, and exercise? Eat a healthy diet   Eat a diet that includes plenty of vegetables, fruits, low-fat dairy products, and lean protein.  Do not eat a lot of foods that are high in solid fats, added sugars, or sodium. Maintain a healthy weight Body mass index (BMI) is used to identify weight problems. It estimates body fat based on height and weight. Your health care provider can help determine your BMI and help you achieve or maintain a healthy weight. Get regular exercise Get regular exercise. This is one of the most important things you can do for your health. Most adults should:  Exercise for at least 150 minutes each week. The exercise should increase your heart rate and make you sweat (moderate-intensity exercise).  Do strengthening exercises at least twice a week. This is in addition to the moderate-intensity exercise.  Spend less time sitting. Even light physical activity can be beneficial. Watch cholesterol and blood lipids Have your blood tested for lipids and cholesterol at 42 years of age, then have this test every 5 years. Have your cholesterol levels checked more often if:  Your lipid or cholesterol levels are high.  You are older than 42 years of age.  You are at high risk for heart disease. What should I know about cancer screening? Depending on your health history and family history, you may need to have cancer screening at various ages. This may include screening for:  Breast cancer.  Cervical cancer.  Colorectal cancer.  Skin cancer.  Lung cancer. What should I  know about heart disease, diabetes, and high blood pressure? Blood pressure and heart disease  High blood pressure causes heart disease and increases the risk of stroke. This is more likely to develop in people who have high blood pressure readings, are of African descent, or are overweight.  Have your blood pressure checked: ? Every 3-5 years if you are 84-42 years of age. ? Every year if you are 73 years old or older. Diabetes Have regular diabetes screenings. This checks your fasting blood sugar level. Have the screening done:  Once every three years after age 42 if you are at a normal weight and have a low risk for diabetes.  More often and at a younger age if you are overweight or have a high risk for diabetes. What should I know about preventing infection? Hepatitis B If you have a higher risk for hepatitis B, you should be screened for this virus. Talk with your health care provider to find out if you are at risk for hepatitis B infection. Hepatitis C Testing is recommended for:  Everyone born from [birth year] through 1965.  Anyone with known risk factors for hepatitis C. Sexually transmitted infections (STIs)  Get screened for STIs, including gonorrhea and chlamydia, if: ? You are sexually active and are younger than 42 years of age. ? You are older than 42 years of age and your health care provider tells you that you are at risk for this type of infection. ? Your sexual activity has changed since you were last screened, and you are at  increased risk for chlamydia or gonorrhea. Ask your health care provider if you are at risk.  Ask your health care provider about whether you are at high risk for HIV. Your health care provider may recommend a prescription medicine to help prevent HIV infection. If you choose to take medicine to prevent HIV, you should first get tested for HIV. You should then be tested every 3 months for as long as you are taking the medicine. Pregnancy  If you are  about to stop having your period (premenopausal) and you may become pregnant, seek counseling before you get pregnant.  Take 400 to 800 micrograms (mcg) of folic acid every day if you become pregnant.  Ask for birth control (contraception) if you want to prevent pregnancy. Osteoporosis and menopause Osteoporosis is a disease in which the bones lose minerals and strength with aging. This can result in bone fractures. If you are 42 years old or older, or if you are at risk for osteoporosis and fractures, ask your health care provider if you should:  Be screened for bone loss.  Take a calcium or vitamin D supplement to lower your risk of fractures.  Be given hormone replacement therapy (HRT) to treat symptoms of menopause. Follow these instructions at home: Lifestyle  Do not use any products that contain nicotine or tobacco, such as cigarettes, e-cigarettes, and chewing tobacco. If you need help quitting, ask your health care provider.  Do not use street drugs.  Do not share needles.  Ask your health care provider for help if you need support or information about quitting drugs. Alcohol use  Do not drink alcohol if: ? Your health care provider tells you not to drink. ? You are pregnant, may be pregnant, or are planning to become pregnant.  If you drink alcohol: ? Limit how much you use to 0-1 drink a day. ? Limit intake if you are breastfeeding.  Be aware of how much alcohol is in your drink. In the [place] one drink equals one 12 oz bottle of beer (355 mL), one 5 oz glass of wine (148 mL), or one 1 oz glass of hard liquor (44 mL). General instructions  Schedule regular health, dental, and eye exams.  Stay current with your vaccines.  Tell your health care provider if: ? You often feel depressed. ? You have ever been abused or do not feel safe at home. Summary  Adopting a healthy lifestyle and getting preventive care are important in promoting health and wellness.  Follow  your health care provider's instructions about healthy diet, exercising, and getting tested or screened for diseases.  Follow your health care provider's instructions on monitoring your cholesterol and blood pressure. This information is not intended to replace advice given to you by your health care provider. Make sure you discuss any questions you have with your health care provider. Document Revised: 02/10/2018 Document Reviewed: 02/10/2018 Elsevier Patient Education  2020 [employer].

## 2019-10-03 NOTE — Progress Notes (Signed)
Subjective:    Patient ID: Nicole Hunter, female    DOB: 1976/03/03, 44 y.o.   MRN: [id number]  CC: Nicole Hunter is a 44 y.o. female who presents today for physical exam.    HPI: Feels well today No complaints No depression.  Children are doing well.  Reports 5 lb weight gain over past year, attributes to snacking at night when charting.     H/o iron deficiency  History of hematuria, follows with Dr. Thedore Mins Colorectal Cancer Screening: No early family history.  Breast Cancer Screening: Diagnostic Mammogram & right [place] scheduled. History of right breast biopsy.  Cervical Cancer Screening: Due. She declines today. follows with GYN, Dr Elesa Massed.          Tetanus - utd         Hepatitis C screening - Candidate for, consents  Labs: Screening labs today. Exercise: Gets regular exercise, walking. Using standing desk at home. Alcohol use: Occassional Smoking/tobacco use: Nonsmoker.    HISTORY:  History reviewed. No pertinent past medical history.  Past Surgical History:  Procedure Laterality Date  . BREAST BIOPSY Right 2008  . BREAST CYST ASPIRATION Right 2008  . left breast cyst Left   . right breast biopsy Right    Family History  Problem Relation Age of Onset  . Colon cancer Neg Hx   . Breast cancer Neg Hx       ALLERGIES: Patient has no known allergies.  Current Outpatient Medications on File Prior to Visit  Medication Sig Dispense Refill  . azelastine (ASTELIN) 0.1 % nasal spray Place 1 spray into both nostrils 2 (two) times daily. Use in each nostril as directed 30 mL 6  . Cholecalciferol (VITAMIN D3) 5000 units CAPS Take by mouth daily.     Marland Kitchen omeprazole (PRILOSEC) 20 MG capsule Take by mouth.     No current facility-administered medications on file prior to visit.    Social History   Tobacco Use  . Smoking status: Never Smoker  . Smokeless tobacco: Never Used  Substance Use Topics  . Alcohol use: Yes  . Drug use: Never    Review of Systems  Constitutional:  Negative for chills, fever and unexpected weight change.  HENT: Negative for congestion.   Respiratory: Negative for cough.   Cardiovascular: Negative for chest pain, palpitations and leg swelling.  Gastrointestinal: Negative for nausea and vomiting.  Musculoskeletal: Negative for arthralgias and myalgias.  Skin: Negative for rash.  Neurological: Negative for headaches.  Hematological: Negative for adenopathy.  Psychiatric/Behavioral: Negative for confusion.      Objective:    BP 116/82 (BP Location: Left Arm, Patient Position: Sitting)   Pulse 71   Temp 97.8 F (36.6 C)   Ht 5' 6.26" (1.683 m)   Wt 177 lb 12.8 oz (80.6 kg)   SpO2 97%   BMI 28.47 kg/m   BP Readings from Last 3 Encounters:  10/03/19 116/82  09/24/18 100/68  08/14/17 118/78   Wt Readings from Last 3 Encounters:  10/03/19 177 lb 12.8 oz (80.6 kg)  09/24/18 172 lb 1.9 oz (78.1 kg)  08/14/17 170 lb 2 oz (77.2 kg)    Physical Exam Vitals reviewed.  Constitutional:      Appearance: She is well-developed.  Eyes:     Conjunctiva/sclera: Conjunctivae normal.  Neck:     Thyroid: No thyroid mass or thyromegaly.  Cardiovascular:     Rate and Rhythm: Normal rate and regular rhythm.     Pulses: Normal pulses.  Heart sounds: Normal heart sounds.  Pulmonary:     Effort: Pulmonary effort is normal.     Breath sounds: Normal breath sounds. No wheezing, rhonchi or rales.  Chest:     Breasts: Breasts are symmetrical.        Right: No inverted nipple, mass, nipple discharge, skin change or tenderness.        Left: No inverted nipple, mass, nipple discharge, skin change or tenderness.  Lymphadenopathy:     Head:     Right side of head: No submental, submandibular, tonsillar, preauricular, posterior auricular or occipital adenopathy.     Left side of head: No submental, submandibular, tonsillar, preauricular, posterior auricular or occipital adenopathy.     Cervical: No cervical adenopathy.     Right cervical:  No superficial, deep or posterior cervical adenopathy.    Left cervical: No superficial, deep or posterior cervical adenopathy.  Skin:    General: Skin is warm and dry.  Neurological:     Mental Status: She is alert.  Psychiatric:        Speech: Speech normal.        Behavior: Behavior normal.        Thought Content: Thought content normal.        Assessment & Plan:   Problem List Items Addressed This Visit      Other   Hematuria   Relevant Orders   Urinalysis, Routine w reflex microscopic   History of vitamin D deficiency   Relevant Orders   VITAMIN D 25 Hydroxy (Vit-D Deficiency, Fractures)   Iron deficiency   Relevant Orders   CBC with Differential/Platelet   Ferritin   Iron Binding Cap (TIBC)([facility])   Routine physical examination - Primary    Clinical breast exam performed today.  She declines pelvic exam with Pap smear.  She will reach out to Dr. Elesa Massed for this.  We discussed increasing exercise, decreasing eating after dinner. She will do labs at [facility] at her convenience.       Relevant Orders   TSH   CBC with Differential/Platelet   Comprehensive metabolic panel   Hemoglobin A1c   Lipid panel   VITAMIN D 25 Hydroxy (Vit-D Deficiency, Fractures)   Ferritin   Iron Binding Cap (TIBC)([facility])    Other Visit Diagnoses    Encounter for lipid screening for cardiovascular disease       Relevant Orders   Hemoglobin A1c   Lipid panel       I am having [name] maintain her Vitamin D3, azelastine, and omeprazole.   No orders of the defined types were placed in this encounter.   Return precautions given.   Risks, benefits, and alternatives of the medications and treatment plan prescribed today were discussed, and patient expressed understanding.   Education regarding symptom management and diagnosis given to patient on AVS.   Continue to follow with Allegra Grana, FNP for routine health maintenance.   [name] and I agreed with plan.    Rennie Plowman, FNP

## 2019-10-04 ENCOUNTER — Encounter: Payer: Self-pay | Admitting: Family

## 2019-10-04 NOTE — Assessment & Plan Note (Signed)
Clinical breast exam performed today.  She declines pelvic exam with Pap smear.  She will reach out to Dr. Elesa Massed for this.  We discussed increasing exercise, decreasing eating after dinner. She will do labs at [facility] at her convenience.

## 2019-10-11 ENCOUNTER — Other Ambulatory Visit: Payer: 59

## 2019-10-13 ENCOUNTER — Ambulatory Visit
Admission: RE | Admit: 2019-10-13 | Discharge: 2019-10-13 | Disposition: A | Discharge status: Home / Self Care | Payer: 59 | Source: Ambulatory Visit | Attending: Family | Admitting: Family

## 2019-10-13 ENCOUNTER — Other Ambulatory Visit: Payer: Self-pay | Admitting: Family

## 2019-10-13 ENCOUNTER — Other Ambulatory Visit
Admission: RE | Admit: 2019-10-13 | Discharge: 2019-10-13 | Disposition: A | Discharge status: Home / Self Care | Payer: 59 | Source: Home / Self Care | Attending: Family | Admitting: Family

## 2019-10-13 ENCOUNTER — Other Ambulatory Visit: Payer: Self-pay

## 2019-10-13 ENCOUNTER — Telehealth: Payer: Self-pay

## 2019-10-13 DIAGNOSIS — E611 Iron deficiency: Secondary | ICD-10-CM

## 2019-10-13 DIAGNOSIS — R922 Inconclusive mammogram: Secondary | ICD-10-CM | POA: Diagnosis not present

## 2019-10-13 DIAGNOSIS — Z0000 Encounter for general adult medical examination without abnormal findings: Secondary | ICD-10-CM

## 2019-10-13 DIAGNOSIS — N6489 Other specified disorders of breast: Secondary | ICD-10-CM | POA: Diagnosis not present

## 2019-10-13 DIAGNOSIS — R928 Other abnormal and inconclusive findings on diagnostic imaging of breast: Secondary | ICD-10-CM

## 2019-10-13 DIAGNOSIS — Z8639 Personal history of other endocrine, nutritional and metabolic disease: Secondary | ICD-10-CM | POA: Insufficient documentation

## 2019-10-13 DIAGNOSIS — R319 Hematuria, unspecified: Secondary | ICD-10-CM | POA: Insufficient documentation

## 2019-10-13 DIAGNOSIS — Z136 Encounter for screening for cardiovascular disorders: Secondary | ICD-10-CM | POA: Insufficient documentation

## 2019-10-13 DIAGNOSIS — Z13220 Encounter for screening for lipoid disorders: Secondary | ICD-10-CM | POA: Insufficient documentation

## 2019-10-13 DIAGNOSIS — N6311 Unspecified lump in the right breast, upper outer quadrant: Secondary | ICD-10-CM | POA: Diagnosis not present

## 2019-10-13 DIAGNOSIS — N6313 Unspecified lump in the right breast, lower outer quadrant: Secondary | ICD-10-CM | POA: Diagnosis not present

## 2019-10-13 LAB — CBC WITH DIFFERENTIAL/PLATELET
Abs Immature Granulocytes: 0.03 10*3/uL (ref 0.00–0.07)
Basophils Absolute: 0 10*3/uL (ref 0.0–0.1)
Basophils Relative: 0 %
Eosinophils Absolute: 0.3 10*3/uL (ref 0.0–0.5)
Eosinophils Relative: 3 %
HCT: 41.9 % (ref 36.0–46.0)
Hemoglobin: 14.4 g/dL (ref 12.0–15.0)
Immature Granulocytes: 0 %
Lymphocytes Relative: 21 %
Lymphs Abs: 1.8 10*3/uL (ref 0.7–4.0)
MCH: 30 pg (ref 26.0–34.0)
MCHC: 34.4 g/dL (ref 30.0–36.0)
MCV: 87.3 fL (ref 80.0–100.0)
Monocytes Absolute: 0.5 10*3/uL (ref 0.1–1.0)
Monocytes Relative: 5 %
Neutro Abs: 6.1 10*3/uL (ref 1.7–7.7)
Neutrophils Relative %: 71 %
Platelets: 233 10*3/uL (ref 150–400)
RBC: 4.8 MIL/uL (ref 3.87–5.11)
RDW: 11.9 % (ref 11.5–15.5)
WBC: 8.7 10*3/uL (ref 4.0–10.5)
nRBC: 0 % (ref 0.0–0.2)

## 2019-10-13 LAB — COMPREHENSIVE METABOLIC PANEL
ALT: 15 U/L (ref 0–44)
AST: 16 U/L (ref 15–41)
Albumin: 4.7 g/dL (ref 3.5–5.0)
Alkaline Phosphatase: 46 U/L (ref 38–126)
Anion gap: 8 (ref 5–15)
BUN: 11 mg/dL (ref 6–20)
CO2: 28 mmol/L (ref 22–32)
Calcium: 9.2 mg/dL (ref 8.9–10.3)
Chloride: 102 mmol/L (ref 98–111)
Creatinine, Ser: 0.65 mg/dL (ref 0.44–1.00)
GFR calc Af Amer: >60 mL/min (ref >60)
GFR calc non Af Amer: >60 mL/min (ref >60)
Glucose, Bld: 97 mg/dL (ref 70–99)
Potassium: 4 mmol/L (ref 3.5–5.1)
Sodium: 138 mmol/L (ref 135–145)
Total Bilirubin: 1.1 mg/dL (ref 0.3–1.2)
Total Protein: 7.5 g/dL (ref 6.5–8.1)

## 2019-10-13 LAB — LIPID PANEL
Cholesterol: 231 mg/dL — ABNORMAL HIGH (ref 0–200)
HDL: 62 mg/dL (ref >40)
LDL Cholesterol: 152 mg/dL — ABNORMAL HIGH (ref 0–99)
Total CHOL/HDL Ratio: 3.7 RATIO
Triglycerides: 83 mg/dL (ref <150)
VLDL: 17 mg/dL (ref 0–40)

## 2019-10-13 LAB — URINALYSIS, ROUTINE W REFLEX MICROSCOPIC
Bacteria, UA: NONE SEEN
Bilirubin Urine: NEGATIVE
Glucose, UA: NEGATIVE mg/dL
Ketones, ur: NEGATIVE mg/dL
Leukocytes,Ua: NEGATIVE
Nitrite: NEGATIVE
Protein, ur: NEGATIVE mg/dL
Specific Gravity, Urine: 1.004 — ABNORMAL LOW (ref 1.005–1.030)
pH: 6 (ref 5.0–8.0)

## 2019-10-13 LAB — VITAMIN D 25 HYDROXY (VIT D DEFICIENCY, FRACTURES): Vit D, 25-Hydroxy: 28.34 ng/mL — ABNORMAL LOW (ref 30–100)

## 2019-10-13 LAB — FERRITIN: Ferritin: 34 ng/mL (ref 11–307)

## 2019-10-13 LAB — HEMOGLOBIN A1C
Hgb A1c MFr Bld: 4.8 % (ref 4.8–5.6)
Mean Plasma Glucose: 91.06 mg/dL

## 2019-10-13 LAB — TSH: TSH: 3.84 u[IU]/mL (ref 0.350–4.500)

## 2019-10-13 LAB — IRON AND TIBC
Iron: 100 ug/dL (ref 28–170)
Saturation Ratios: 28 % (ref 10.4–31.8)
TIBC: 364 ug/dL (ref 250–450)
UIBC: 264 ug/dL

## 2019-10-13 NOTE — Telephone Encounter (Signed)
Misty Stanley from [facility] called and asked for verbal orders to do a Left Breast Ultrasound on Nicole Hunter.

## 2019-10-18 ENCOUNTER — Encounter: Payer: Self-pay | Admitting: Family

## 2019-10-18 ENCOUNTER — Other Ambulatory Visit: Payer: Self-pay | Admitting: Family

## 2019-10-18 DIAGNOSIS — R8290 Unspecified abnormal findings in urine: Secondary | ICD-10-CM

## 2019-11-02 ENCOUNTER — Other Ambulatory Visit: Payer: Self-pay | Admitting: Family

## 2019-11-02 DIAGNOSIS — J309 Allergic rhinitis, unspecified: Secondary | ICD-10-CM

## 2020-11-15 ENCOUNTER — Telehealth: Payer: Self-pay | Admitting: Family

## 2020-11-15 DIAGNOSIS — N6310 Unspecified lump in the right breast, unspecified quadrant: Secondary | ICD-10-CM

## 2020-11-15 NOTE — Telephone Encounter (Signed)
Will you please place order?

## 2020-11-15 NOTE — Telephone Encounter (Signed)
I called patient & advised her that diagnostic was ordered. I have called and scheduled for patient. Pt is aware of date & time.

## 2020-11-15 NOTE — Telephone Encounter (Signed)
Call pt Let her know that I ordered diag bilateral mammogram and right breast ultrasound Please ask her to call [facility] and let us know of any issues in doing so

## 2020-11-15 NOTE — Telephone Encounter (Signed)
The patient called stating it is time for her Bilateral diagnostic breast mammogram with right breast  ultrasound. Please submit order this will need to be called into [facility] the patient will not be able to schedule.

## 2020-11-23 ENCOUNTER — Ambulatory Visit
Admission: RE | Admit: 2020-11-23 | Discharge: 2020-11-23 | Disposition: A | Discharge status: Home / Self Care | Payer: 59 | Source: Ambulatory Visit | Attending: Family | Admitting: Family

## 2020-11-23 ENCOUNTER — Other Ambulatory Visit: Payer: Self-pay

## 2020-11-23 DIAGNOSIS — R922 Inconclusive mammogram: Secondary | ICD-10-CM | POA: Diagnosis not present

## 2020-11-23 DIAGNOSIS — N6310 Unspecified lump in the right breast, unspecified quadrant: Secondary | ICD-10-CM | POA: Insufficient documentation

## 2020-12-14 ENCOUNTER — Encounter: Payer: 59 | Admitting: Family

## 2020-12-21 ENCOUNTER — Ambulatory Visit (INDEPENDENT_AMBULATORY_CARE_PROVIDER_SITE_OTHER): Payer: 59 | Admitting: Family

## 2020-12-21 ENCOUNTER — Other Ambulatory Visit: Payer: Self-pay

## 2020-12-21 ENCOUNTER — Encounter: Payer: Self-pay | Admitting: Family

## 2020-12-21 VITALS — BP 112/68 | HR 85 | Temp 98.5°F | Ht 66.25 in | Wt 171.4 lb

## 2020-12-21 DIAGNOSIS — J309 Allergic rhinitis, unspecified: Secondary | ICD-10-CM

## 2020-12-21 DIAGNOSIS — Z0000 Encounter for general adult medical examination without abnormal findings: Secondary | ICD-10-CM | POA: Diagnosis not present

## 2020-12-21 MED ORDER — AZELASTINE HCL 0.1 % NA SOLN
1.0000 | Freq: Two times a day (BID) | NASAL | 6 refills | Status: DC
  Filled 2020-12-21: qty 60, 90d supply, fill #0
  Filled 2021-07-12: qty 90, 90d supply, fill #0

## 2020-12-21 MED ORDER — CROMOLYN SODIUM 5.2 MG/ACT NA AERS
1.0000 | INHALATION_SPRAY | Freq: Four times a day (QID) | NASAL | 12 refills | Status: AC
  Filled 2020-12-21 – 2021-09-28 (×2): qty 26, 50d supply, fill #0

## 2020-12-21 NOTE — Assessment & Plan Note (Signed)
Politely declines clinical breast exam today.  Mammogram is up-to-date.  GYN referral placed as requested.  Congratulated patient on diligence exercise

## 2020-12-21 NOTE — Patient Instructions (Signed)
Always a pleasure seeing you!  Health Maintenance, Female Adopting a healthy lifestyle and getting preventive care are important in promoting health and wellness. Ask your health care provider about: The right schedule for you to have regular tests and exams. Things you can do on your own to prevent diseases and keep yourself healthy. What should I know about diet, weight, and exercise? Eat a healthy diet  Eat a diet that includes plenty of vegetables, fruits, low-fat dairy products, and lean protein. Do not eat a lot of foods that are high in solid fats, added sugars, or sodium. Maintain a healthy weight Body mass index (BMI) is used to identify weight problems. It estimates body fat based on height and weight. Your health care provider can help determine your BMI and help you achieve or maintain a healthy weight. Get regular exercise Get regular exercise. This is one of the most important things you can do for your health. Most adults should: Exercise for at least 150 minutes each week. The exercise should increase your heart rate and make you sweat (moderate-intensity exercise). Do strengthening exercises at least twice a week. This is in addition to the moderate-intensity exercise. Spend less time sitting. Even light physical activity can be beneficial. Watch cholesterol and blood lipids Have your blood tested for lipids and cholesterol at 43 years of age, then have this test every 5 years. Have your cholesterol levels checked more often if: Your lipid or cholesterol levels are high. You are older than 43 years of age. You are at high risk for heart disease. What should I know about cancer screening? Depending on your health history and family history, you may need to have cancer screening at various ages. This may include screening for: Breast cancer. Cervical cancer. Colorectal cancer. Skin cancer. Lung cancer. What should I know about heart disease, diabetes, and high blood  pressure? Blood pressure and heart disease High blood pressure causes heart disease and increases the risk of stroke. This is more likely to develop in people who have high blood pressure readings, are of African descent, or are overweight. Have your blood pressure checked: Every 3-5 years if you are 29-67 years of age. Every year if you are 8 years old or older. Diabetes Have regular diabetes screenings. This checks your fasting blood sugar level. Have the screening done: Once every three years after age 46 if you are at a normal weight and have a low risk for diabetes. More often and at a younger age if you are overweight or have a high risk for diabetes. What should I know about preventing infection? Hepatitis B If you have a higher risk for hepatitis B, you should be screened for this virus. Talk with your health care provider to find out if you are at risk for hepatitis B infection. Hepatitis C Testing is recommended for: Everyone born from [birth year] through 1965. Anyone with known risk factors for hepatitis C. Sexually transmitted infections (STIs) Get screened for STIs, including gonorrhea and chlamydia, if: You are sexually active and are younger than 43 years of age. You are older than 43 years of age and your health care provider tells you that you are at risk for this type of infection. Your sexual activity has changed since you were last screened, and you are at increased risk for chlamydia or gonorrhea. Ask your health care provider if you are at risk. Ask your health care provider about whether you are at high risk for HIV. Your health care  provider may recommend a prescription medicine to help prevent HIV infection. If you choose to take medicine to prevent HIV, you should first get tested for HIV. You should then be tested every 3 months for as long as you are taking the medicine. Pregnancy If you are about to stop having your period (premenopausal) and you may become pregnant,  seek counseling before you get pregnant. Take 400 to 800 micrograms (mcg) of folic acid every day if you become pregnant. Ask for birth control (contraception) if you want to prevent pregnancy. Osteoporosis and menopause Osteoporosis is a disease in which the bones lose minerals and strength with aging. This can result in bone fractures. If you are 85 years old or older, or if you are at risk for osteoporosis and fractures, ask your health care provider if you should: Be screened for bone loss. Take a calcium or vitamin D supplement to lower your risk of fractures. Be given hormone replacement therapy (HRT) to treat symptoms of menopause. Follow these instructions at home: Lifestyle Do not use any products that contain nicotine or tobacco, such as cigarettes, e-cigarettes, and chewing tobacco. If you need help quitting, ask your health care provider. Do not use street drugs. Do not share needles. Ask your health care provider for help if you need support or information about quitting drugs. Alcohol use Do not drink alcohol if: Your health care provider tells you not to drink. You are pregnant, may be pregnant, or are planning to become pregnant. If you drink alcohol: Limit how much you use to 0-1 drink a day. Limit intake if you are breastfeeding. Be aware of how much alcohol is in your drink. In the [place] one drink equals one 12 oz bottle of beer (355 mL), one 5 oz glass of wine (148 mL), or one 1 oz glass of hard liquor (44 mL). General instructions Schedule regular health, dental, and eye exams. Stay current with your vaccines. Tell your health care provider if: You often feel depressed. You have ever been abused or do not feel safe at home. Summary Adopting a healthy lifestyle and getting preventive care are important in promoting health and wellness. Follow your health care provider's instructions about healthy diet, exercising, and getting tested or screened for diseases. Follow  your health care provider's instructions on monitoring your cholesterol and blood pressure. This information is not intended to replace advice given to you by your health care provider. Make sure you discuss any questions you have with your health care provider. Document Revised: 04/27/2020 Document Reviewed: 02/10/2018 Elsevier Patient Education  2022 [employer].

## 2020-12-21 NOTE — Progress Notes (Signed)
Subjective:    Patient ID: Nicole Hunter, female    DOB: 09/27/1975, 45 y.o.   MRN: [id number]  CC: [name] is a 45 y.o. female who presents today for physical exam.    HPI: Feels well today.  No new complaints.  She has started an exercise program over the past 3 to 4 months and is pleased with weight loss.  She is also doing intermittent fasting, fasting for 16 hours.  She feels like these things have been helpful.  She continues to work on eating a healthy diet. H/o seasonal allergies. She has been on nasalcrom in the past which was effective. She would like to use when not using azelastine.    Colorectal Cancer Screening: due Breast Cancer Screening: Mammogram UTD; stable right breast mass Cervical Cancer Screening: due; no pelvic pain.  Menses are still regular.  She would like a referral to GYN         Tetanus - utd         Hepatitis C screening - Candidate for, consents Labs: Screening labs today. Exercise: Gets regular exercise 4 days a week for 20 minutes.   Alcohol use: Occasional Smoking/tobacco use: Nonsmoker.     HISTORY:  History reviewed. No pertinent past medical history.  Past Surgical History:  Procedure Laterality Date   BREAST BIOPSY Right 2008   BREAST CYST ASPIRATION Right 2008   left breast cyst Left    right breast biopsy Right    Family History  Problem Relation Age of Onset   Colon cancer Neg Hx    Breast cancer Neg Hx       ALLERGIES: Penicillins  Current Outpatient Medications on File Prior to Visit  Medication Sig Dispense Refill   Cholecalciferol (VITAMIN D3) 5000 units CAPS Take by mouth daily.      omeprazole (PRILOSEC) 20 MG capsule Take by mouth.     No current facility-administered medications on file prior to visit.    Social History   Tobacco Use   Smoking status: Never   Smokeless tobacco: Never  Substance Use Topics   Alcohol use: Yes   Drug use: Never    Review of Systems  Constitutional:  Negative for chills, fever and  unexpected weight change.  HENT:  Negative for congestion.   Respiratory:  Negative for cough.   Cardiovascular:  Negative for chest pain, palpitations and leg swelling.  Gastrointestinal:  Negative for nausea and vomiting.  Genitourinary:  Negative for pelvic pain.  Musculoskeletal:  Negative for arthralgias and myalgias.  Skin:  Negative for rash.  Neurological:  Negative for headaches.  Hematological:  Negative for adenopathy.  Psychiatric/Behavioral:  Negative for confusion.      Objective:    BP 112/68 (BP Location: Left Arm, Patient Position: Sitting, Cuff Size: Normal)   Pulse 85   Temp 98.5 F (36.9 C) (Oral)   Ht 5' 6.25" (1.683 m)   Wt 171 lb 6.4 oz (77.7 kg)   SpO2 99%   BMI 27.46 kg/m   BP Readings from Last 3 Encounters:  12/21/20 112/68  10/03/19 116/82  09/24/18 100/68   Wt Readings from Last 3 Encounters:  12/21/20 171 lb 6.4 oz (77.7 kg)  10/03/19 177 lb 12.8 oz (80.6 kg)  09/24/18 172 lb 1.9 oz (78.1 kg)    Physical Exam Vitals reviewed.  Constitutional:      Appearance: Normal appearance. She is well-developed.  Eyes:     Conjunctiva/sclera: Conjunctivae normal.  Neck:  Thyroid: No thyroid mass or thyromegaly.  Cardiovascular:     Rate and Rhythm: Normal rate and regular rhythm.     Pulses: Normal pulses.     Heart sounds: Normal heart sounds.  Pulmonary:     Effort: Pulmonary effort is normal.     Breath sounds: Normal breath sounds. No wheezing, rhonchi or rales.  Abdominal:     General: Bowel sounds are normal. There is no distension.     Palpations: Abdomen is soft. Abdomen is not rigid. There is no fluid wave or mass.     Tenderness: There is no abdominal tenderness. There is no guarding or rebound.  Lymphadenopathy:     Head:     Right side of head: No submental, submandibular, tonsillar, preauricular, posterior auricular or occipital adenopathy.     Left side of head: No submental, submandibular, tonsillar, preauricular, posterior  auricular or occipital adenopathy.     Cervical: No cervical adenopathy.  Skin:    General: Skin is warm and dry.  Neurological:     Mental Status: She is alert.  Psychiatric:        Speech: Speech normal.        Behavior: Behavior normal.        Thought Content: Thought content normal.       Assessment & Plan:   Problem List Items Addressed This Visit       Other   Routine physical examination - Primary    Politely declines clinical breast exam today.  Mammogram is up-to-date.  GYN referral placed as requested.  Congratulated patient on diligence exercise      Relevant Orders   Ambulatory referral to Obstetrics / Gynecology   VITAMIN D 25 Hydroxy (Vit-D Deficiency, Fractures)   Hemoglobin A1c   TSH   CBC with Differential/Platelet   Comprehensive metabolic panel   Lipid panel   Hepatitis C Antibody   Other Visit Diagnoses     Allergic rhinitis, unspecified seasonality, unspecified trigger       Relevant Medications   cromolyn (NASALCROM) 5.2 MG/ACT nasal spray   azelastine (ASTELIN) 0.1 % nasal spray        I am having [name] start on cromolyn. I am also having her maintain her Vitamin D3, omeprazole, and azelastine.   Meds ordered this encounter  Medications   cromolyn (NASALCROM) 5.2 MG/ACT nasal spray    Sig: Place 1 spray into both nostrils 4 (four) times daily.    Dispense:  26 mL    Refill:  12    Order Specific Question:   Supervising Provider    Answer:   Duncan Dull L [[id number]]   azelastine (ASTELIN) 0.1 % nasal spray    Sig: Place 1 spray into both nostrils 2 (two) times daily. Use in each nostril as directed    Dispense:  90 mL    Refill:  6    PATIENT REQUESTS 48-MONTH SUPPLY    Order Specific Question:   Supervising Provider    Answer:   Sherlene Shams [[id number]]     Return precautions given.   Risks, benefits, and alternatives of the medications and treatment plan prescribed today were discussed, and patient expressed understanding.    Education regarding symptom management and diagnosis given to patient on AVS.   Continue to follow with Allegra Grana, FNP for routine health maintenance.   [name] and I agreed with plan.   Rennie Plowman, FNP

## 2021-01-01 ENCOUNTER — Other Ambulatory Visit: Payer: Self-pay

## 2021-01-29 ENCOUNTER — Other Ambulatory Visit
Admission: RE | Admit: 2021-01-29 | Discharge: 2021-01-29 | Disposition: A | Discharge status: Home / Self Care | Payer: 59 | Attending: Family | Admitting: Family

## 2021-01-29 DIAGNOSIS — Z0000 Encounter for general adult medical examination without abnormal findings: Secondary | ICD-10-CM | POA: Insufficient documentation

## 2021-01-29 LAB — CBC WITH DIFFERENTIAL/PLATELET
Abs Immature Granulocytes: 0.02 10*3/uL (ref 0.00–0.07)
Basophils Absolute: 0 10*3/uL (ref 0.0–0.1)
Basophils Relative: 1 %
Eosinophils Absolute: 0.2 10*3/uL (ref 0.0–0.5)
Eosinophils Relative: 3 %
HCT: 40.5 % (ref 36.0–46.0)
Hemoglobin: 13.9 g/dL (ref 12.0–15.0)
Immature Granulocytes: 0 %
Lymphocytes Relative: 31 %
Lymphs Abs: 1.7 10*3/uL (ref 0.7–4.0)
MCH: 30.2 pg (ref 26.0–34.0)
MCHC: 34.3 g/dL (ref 30.0–36.0)
MCV: 88 fL (ref 80.0–100.0)
Monocytes Absolute: 0.3 10*3/uL (ref 0.1–1.0)
Monocytes Relative: 5 %
Neutro Abs: 3.3 10*3/uL (ref 1.7–7.7)
Neutrophils Relative %: 60 %
Platelets: 239 10*3/uL (ref 150–400)
RBC: 4.6 MIL/uL (ref 3.87–5.11)
RDW: 12.1 % (ref 11.5–15.5)
WBC: 5.5 10*3/uL (ref 4.0–10.5)
nRBC: 0 % (ref 0.0–0.2)

## 2021-01-29 LAB — COMPREHENSIVE METABOLIC PANEL
ALT: 15 U/L (ref 0–44)
AST: 16 U/L (ref 15–41)
Albumin: 4.5 g/dL (ref 3.5–5.0)
Alkaline Phosphatase: 42 U/L (ref 38–126)
Anion gap: 6 (ref 5–15)
BUN: 12 mg/dL (ref 6–20)
CO2: 26 mmol/L (ref 22–32)
Calcium: 8.8 mg/dL — ABNORMAL LOW (ref 8.9–10.3)
Chloride: 104 mmol/L (ref 98–111)
Creatinine, Ser: 0.64 mg/dL (ref 0.44–1.00)
GFR, Estimated: >60 mL/min (ref >60)
Glucose, Bld: 99 mg/dL (ref 70–99)
Potassium: 4.1 mmol/L (ref 3.5–5.1)
Sodium: 136 mmol/L (ref 135–145)
Total Bilirubin: 0.9 mg/dL (ref 0.3–1.2)
Total Protein: 7.1 g/dL (ref 6.5–8.1)

## 2021-01-29 LAB — LIPID PANEL
Cholesterol: 223 mg/dL — ABNORMAL HIGH (ref 0–200)
HDL: 66 mg/dL (ref >40)
LDL Cholesterol: 140 mg/dL — ABNORMAL HIGH (ref 0–99)
Total CHOL/HDL Ratio: 3.4 RATIO
Triglycerides: 84 mg/dL (ref <150)
VLDL: 17 mg/dL (ref 0–40)

## 2021-01-29 LAB — HEMOGLOBIN A1C
Hgb A1c MFr Bld: 5.2 % (ref 4.8–5.6)
Mean Plasma Glucose: 103 mg/dL

## 2021-01-29 LAB — HEPATITIS C ANTIBODY: HCV Ab: NONREACTIVE

## 2021-01-29 LAB — VITAMIN D 25 HYDROXY (VIT D DEFICIENCY, FRACTURES): Vit D, 25-Hydroxy: 19.16 ng/mL — ABNORMAL LOW (ref 30–100)

## 2021-01-29 LAB — TSH: TSH: 3.913 u[IU]/mL (ref 0.350–4.500)

## 2021-01-30 ENCOUNTER — Other Ambulatory Visit: Payer: Self-pay | Admitting: Family

## 2021-02-02 ENCOUNTER — Encounter: Payer: Self-pay | Admitting: Family

## 2021-02-04 ENCOUNTER — Other Ambulatory Visit: Payer: Self-pay | Admitting: Family

## 2021-02-04 DIAGNOSIS — Z8639 Personal history of other endocrine, nutritional and metabolic disease: Secondary | ICD-10-CM

## 2021-03-15 ENCOUNTER — Encounter: Payer: 59 | Admitting: Obstetrics and Gynecology

## 2021-03-27 DIAGNOSIS — Z113 Encounter for screening for infections with a predominantly sexual mode of transmission: Secondary | ICD-10-CM | POA: Diagnosis not present

## 2021-03-27 DIAGNOSIS — N841 Polyp of cervix uteri: Secondary | ICD-10-CM | POA: Diagnosis not present

## 2021-03-27 DIAGNOSIS — Z01419 Encounter for gynecological examination (general) (routine) without abnormal findings: Secondary | ICD-10-CM | POA: Diagnosis not present

## 2021-03-27 DIAGNOSIS — Z1331 Encounter for screening for depression: Secondary | ICD-10-CM | POA: Diagnosis not present

## 2021-03-27 LAB — RESULTS CONSOLE HPV: CHL HPV: NEGATIVE

## 2021-03-27 LAB — HM PAP SMEAR: HM Pap smear: NEGATIVE

## 2021-07-12 ENCOUNTER — Other Ambulatory Visit: Payer: Self-pay

## 2021-09-29 ENCOUNTER — Other Ambulatory Visit: Payer: Self-pay

## 2021-11-08 ENCOUNTER — Other Ambulatory Visit: Payer: Self-pay | Admitting: Family

## 2021-11-08 DIAGNOSIS — Z1231 Encounter for screening mammogram for malignant neoplasm of breast: Secondary | ICD-10-CM

## 2021-12-06 ENCOUNTER — Ambulatory Visit
Admission: RE | Admit: 2021-12-06 | Discharge: 2021-12-06 | Disposition: A | Discharge status: Home / Self Care | Payer: 59 | Source: Ambulatory Visit | Attending: Family | Admitting: Family

## 2021-12-06 DIAGNOSIS — Z1231 Encounter for screening mammogram for malignant neoplasm of breast: Secondary | ICD-10-CM | POA: Diagnosis not present

## 2022-01-06 DIAGNOSIS — R3121 Asymptomatic microscopic hematuria: Secondary | ICD-10-CM | POA: Diagnosis not present

## 2022-01-09 ENCOUNTER — Other Ambulatory Visit: Payer: Self-pay | Admitting: Nephrology

## 2022-01-09 DIAGNOSIS — R3121 Asymptomatic microscopic hematuria: Secondary | ICD-10-CM

## 2022-01-13 DIAGNOSIS — R3121 Asymptomatic microscopic hematuria: Secondary | ICD-10-CM | POA: Diagnosis not present

## 2022-02-07 ENCOUNTER — Ambulatory Visit (INDEPENDENT_AMBULATORY_CARE_PROVIDER_SITE_OTHER): Payer: 59 | Admitting: Family

## 2022-02-07 ENCOUNTER — Encounter: Payer: Self-pay | Admitting: Family

## 2022-02-07 VITALS — BP 118/78 | HR 70 | Temp 98.0°F | Ht 66.0 in | Wt 168.8 lb

## 2022-02-07 DIAGNOSIS — R319 Hematuria, unspecified: Secondary | ICD-10-CM

## 2022-02-07 DIAGNOSIS — Z0000 Encounter for general adult medical examination without abnormal findings: Secondary | ICD-10-CM | POA: Diagnosis not present

## 2022-02-07 DIAGNOSIS — Z8639 Personal history of other endocrine, nutritional and metabolic disease: Secondary | ICD-10-CM

## 2022-02-07 NOTE — Assessment & Plan Note (Signed)
CBE performed today.  Mammogram is up-to-date . patient is following with GYN and Pap smear is up-to-date.  She politely declines Tdap vaccine.

## 2022-02-07 NOTE — Patient Instructions (Signed)
Nice to see you, always.   Health Maintenance, Female Adopting a healthy lifestyle and getting preventive care are important in promoting health and wellness. Ask your health care provider about: The right schedule for you to have regular tests and exams. Things you can do on your own to prevent diseases and keep yourself healthy. What should I know about diet, weight, and exercise? Eat a healthy diet  Eat a diet that includes plenty of vegetables, fruits, low-fat dairy products, and lean protein. Do not eat a lot of foods that are high in solid fats, added sugars, or sodium. Maintain a healthy weight Body mass index (BMI) is used to identify weight problems. It estimates body fat based on height and weight. Your health care provider can help determine your BMI and help you achieve or maintain a healthy weight. Get regular exercise Get regular exercise. This is one of the most important things you can do for your health. Most adults should: Exercise for at least 150 minutes each week. The exercise should increase your heart rate and make you sweat (moderate-intensity exercise). Do strengthening exercises at least twice a week. This is in addition to the moderate-intensity exercise. Spend less time sitting. Even light physical activity can be beneficial. Watch cholesterol and blood lipids Have your blood tested for lipids and cholesterol at 44 years of age, then have this test every 5 years. Have your cholesterol levels checked more often if: Your lipid or cholesterol levels are high. You are older than 44 years of age. You are at high risk for heart disease. What should I know about cancer screening? Depending on your health history and family history, you may need to have cancer screening at various ages. This may include screening for: Breast cancer. Cervical cancer. Colorectal cancer. Skin cancer. Lung cancer. What should I know about heart disease, diabetes, and high blood  pressure? Blood pressure and heart disease High blood pressure causes heart disease and increases the risk of stroke. This is more likely to develop in people who have high blood pressure readings or are overweight. Have your blood pressure checked: Every 3-5 years if you are 72-34 years of age. Every year if you are 93 years old or older. Diabetes Have regular diabetes screenings. This checks your fasting blood sugar level. Have the screening done: Once every three years after age 47 if you are at a normal weight and have a low risk for diabetes. More often and at a younger age if you are overweight or have a high risk for diabetes. What should I know about preventing infection? Hepatitis B If you have a higher risk for hepatitis B, you should be screened for this virus. Talk with your health care provider to find out if you are at risk for hepatitis B infection. Hepatitis C Testing is recommended for: Everyone born from [birth year] through 1965. Anyone with known risk factors for hepatitis C. Sexually transmitted infections (STIs) Get screened for STIs, including gonorrhea and chlamydia, if: You are sexually active and are younger than 44 years of age. You are older than 44 years of age and your health care provider tells you that you are at risk for this type of infection. Your sexual activity has changed since you were last screened, and you are at increased risk for chlamydia or gonorrhea. Ask your health care provider if you are at risk. Ask your health care provider about whether you are at high risk for HIV. Your health care provider may recommend  a prescription medicine to help prevent HIV infection. If you choose to take medicine to prevent HIV, you should first get tested for HIV. You should then be tested every 3 months for as long as you are taking the medicine. Pregnancy If you are about to stop having your period (premenopausal) and you may become pregnant, seek counseling before you  get pregnant. Take 400 to 800 micrograms (mcg) of folic acid every day if you become pregnant. Ask for birth control (contraception) if you want to prevent pregnancy. Osteoporosis and menopause Osteoporosis is a disease in which the bones lose minerals and strength with aging. This can result in bone fractures. If you are 8 years old or older, or if you are at risk for osteoporosis and fractures, ask your health care provider if you should: Be screened for bone loss. Take a calcium or vitamin D supplement to lower your risk of fractures. Be given hormone replacement therapy (HRT) to treat symptoms of menopause. Follow these instructions at home: Alcohol use Do not drink alcohol if: Your health care provider tells you not to drink. You are pregnant, may be pregnant, or are planning to become pregnant. If you drink alcohol: Limit how much you have to: 0-1 drink a day. Know how much alcohol is in your drink. In the [place] one drink equals one 12 oz bottle of beer (355 mL), one 5 oz glass of wine (148 mL), or one 1 oz glass of hard liquor (44 mL). Lifestyle Do not use any products that contain nicotine or tobacco. These products include cigarettes, chewing tobacco, and vaping devices, such as e-cigarettes. If you need help quitting, ask your health care provider. Do not use street drugs. Do not share needles. Ask your health care provider for help if you need support or information about quitting drugs. General instructions Schedule regular health, dental, and eye exams. Stay current with your vaccines. Tell your health care provider if: You often feel depressed. You have ever been abused or do not feel safe at home. Summary Adopting a healthy lifestyle and getting preventive care are important in promoting health and wellness. Follow your health care provider's instructions about healthy diet, exercising, and getting tested or screened for diseases. Follow your health care provider's  instructions on monitoring your cholesterol and blood pressure. This information is not intended to replace advice given to you by your health care provider. Make sure you discuss any questions you have with your health care provider. Document Revised: 07/09/2020 Document Reviewed: 07/09/2020 Elsevier Patient Education  [place].

## 2022-02-07 NOTE — Progress Notes (Signed)
Subjective:    Patient ID: Nicole Hunter, female    DOB: [birth year], 46 y.o.   MRN: [id number]  CC: Nicole Hunter is a 46 y.o. female who presents today for physical exam.    HPI: Feels well today No new complaints She is eating healthy, continues to follow intermittent fasting.    [facility] 01/06/2022 for asymptomatic microscopic hematuria urine protein 24-hour showed elevated protein. US renal scheduled 03/07/21  Colorectal Cancer Screening: No early family history Consult Dr. Leafy Ro January 2023, pap done 03/27/21, negative malignancy, HPV Breast Cancer Screening: Mammogram UTD Cervical Cancer Screening: Follows with Dr Tawanna [facility] screening/DEXA for 65+: No increased fracture risk. Defer screening at this time.  Lung Cancer Screening: Doesn't have 20 year pack year history and age > 29 years yo 59 years        Tetanus - due; declines       Labs: Screening labs today. Exercise: Gets regular exercise.   Alcohol use:  none Smoking/tobacco use: Nonsmoker.     HISTORY:  History reviewed. No pertinent past medical history.  Past Surgical History:  Procedure Laterality Date   BREAST BIOPSY Right 2008   BREAST CYST ASPIRATION Right 2008   left breast cyst Left    right breast biopsy Right    Family History  Problem Relation Age of Onset   Kidney disease Father 4   Colon cancer Neg Hx    Breast cancer Neg Hx       ALLERGIES: Penicillins  Current Outpatient Medications on File Prior to Visit  Medication Sig Dispense Refill   azelastine (ASTELIN) 0.1 % nasal spray Place 1 spray into both nostrils 2 (two) times daily. Use in each nostril as directed 90 mL 6   Cholecalciferol (VITAMIN D3) 5000 units CAPS Take by mouth daily.      cromolyn (NASALCROM) 5.2 MG/ACT nasal spray Place 1 spray into both nostrils 4 (four) times daily. 26 mL 12   omeprazole (PRILOSEC) 20 MG capsule Take by mouth.     No current facility-administered medications on file prior  to visit.    Social History   Tobacco Use   Smoking status: Never   Smokeless tobacco: Never  Substance Use Topics   Alcohol use: Yes   Drug use: Never    Review of Systems  Constitutional:  Negative for chills, fever and unexpected weight change.  HENT:  Negative for congestion.   Respiratory:  Negative for cough.   Cardiovascular:  Negative for chest pain, palpitations and leg swelling.  Gastrointestinal:  Negative for nausea and vomiting.  Musculoskeletal:  Negative for arthralgias and myalgias.  Skin:  Negative for rash.  Neurological:  Negative for headaches.  Hematological:  Negative for adenopathy.  Psychiatric/Behavioral:  Negative for confusion.       Objective:    BP 118/78   Pulse 70   Temp 98 F (36.7 C) (Oral)   Ht 5\' 6"  (1.676 m)   Wt 168 lb 12.8 oz (76.6 kg)   LMP  (LMP Unknown)   SpO2 99%   BMI 27.25 kg/m   BP Readings from Last 3 Encounters:  02/07/22 118/78  12/21/20 112/68  10/03/19 116/82   Wt Readings from Last 3 Encounters:  02/07/22 168 lb 12.8 oz (76.6 kg)  12/21/20 171 lb 6.4 oz (77.7 kg)  10/03/19 177 lb 12.8 oz (80.6 kg)    Physical Exam Vitals reviewed.  Constitutional:      Appearance: Normal appearance. She is  well-developed.  Eyes:     Conjunctiva/sclera: Conjunctivae normal.  Neck:     Thyroid: No thyroid mass or thyromegaly.  Cardiovascular:     Rate and Rhythm: Normal rate and regular rhythm.     Pulses: Normal pulses.     Heart sounds: Normal heart sounds.  Pulmonary:     Effort: Pulmonary effort is normal.     Breath sounds: Normal breath sounds. No wheezing, rhonchi or rales.  Chest:  Breasts:    Breasts are symmetrical.     Right: No inverted nipple, mass, nipple discharge, skin change or tenderness.     Left: No inverted nipple, mass, nipple discharge, skin change or tenderness.  Abdominal:     General: Bowel sounds are normal. There is no distension.     Palpations: Abdomen is soft. Abdomen is not rigid.  There is no fluid wave or mass.     Tenderness: There is no abdominal tenderness. There is no guarding or rebound.  Lymphadenopathy:     Head:     Right side of head: No submental, submandibular, tonsillar, preauricular, posterior auricular or occipital adenopathy.     Left side of head: No submental, submandibular, tonsillar, preauricular, posterior auricular or occipital adenopathy.     Cervical: No cervical adenopathy.     Right cervical: No superficial, deep or posterior cervical adenopathy.    Left cervical: No superficial, deep or posterior cervical adenopathy.  Skin:    General: Skin is warm and dry.  Neurological:     Mental Status: She is alert.  Psychiatric:        Speech: Speech normal.        Behavior: Behavior normal.        Thought Content: Thought content normal.        Assessment & Plan:   Problem List Items Addressed This Visit       Other   Hematuria   Relevant Orders   Comprehensive metabolic panel   CBC with Differential/Platelet   Ferritin   Iron Binding Cap (TIBC)([facility])   History of vitamin D deficiency   Relevant Orders   VITAMIN D 25 Hydroxy (Vit-D Deficiency, Fractures)   Routine physical examination - Primary    CBE performed today.  Mammogram is up-to-date . patient is following with GYN and Pap smear is up-to-date.  She politely declines Tdap vaccine.      Relevant Orders   Comprehensive metabolic panel   CBC with Differential/Platelet   Hemoglobin A1c   Lipid panel   TSH   VITAMIN D 25 Hydroxy (Vit-D Deficiency, Fractures)   Ferritin   Iron Binding Cap (TIBC)([facility])     I am having Nicole Hunter maintain her Vitamin D3, omeprazole, cromolyn, and azelastine.   No orders of the defined types were placed in this encounter.   Return precautions given.   Risks, benefits, and alternatives of the medications and treatment plan prescribed today were discussed, and patient expressed understanding.   Education regarding  symptom management and diagnosis given to patient on AVS.   Continue to follow with Allegra Grana, FNP for routine health maintenance.   Nicole Hunter and I agreed with plan.   Rennie Plowman, FNP

## 2022-02-14 ENCOUNTER — Other Ambulatory Visit
Admission: RE | Admit: 2022-02-14 | Discharge: 2022-02-14 | Disposition: A | Discharge status: Home / Self Care | Payer: 59 | Source: Ambulatory Visit | Attending: Family | Admitting: Family

## 2022-02-14 DIAGNOSIS — Z8639 Personal history of other endocrine, nutritional and metabolic disease: Secondary | ICD-10-CM | POA: Diagnosis not present

## 2022-02-14 DIAGNOSIS — Z0000 Encounter for general adult medical examination without abnormal findings: Secondary | ICD-10-CM | POA: Diagnosis not present

## 2022-02-14 DIAGNOSIS — R319 Hematuria, unspecified: Secondary | ICD-10-CM | POA: Diagnosis not present

## 2022-02-14 LAB — COMPREHENSIVE METABOLIC PANEL
ALT: 13 U/L (ref 0–44)
AST: 16 U/L (ref 15–41)
Albumin: 4.5 g/dL (ref 3.5–5.0)
Alkaline Phosphatase: 40 U/L (ref 38–126)
Anion gap: 9 (ref 5–15)
BUN: 10 mg/dL (ref 6–20)
CO2: 25 mmol/L (ref 22–32)
Calcium: 9.2 mg/dL (ref 8.9–10.3)
Chloride: 104 mmol/L (ref 98–111)
Creatinine, Ser: 0.65 mg/dL (ref 0.44–1.00)
GFR, Estimated: >60 mL/min (ref >60)
Glucose, Bld: 89 mg/dL (ref 70–99)
Potassium: 3.9 mmol/L (ref 3.5–5.1)
Sodium: 138 mmol/L (ref 135–145)
Total Bilirubin: 1.1 mg/dL (ref 0.3–1.2)
Total Protein: 7.2 g/dL (ref 6.5–8.1)

## 2022-02-14 LAB — CBC WITH DIFFERENTIAL/PLATELET
Abs Immature Granulocytes: 0.02 10*3/uL (ref 0.00–0.07)
Basophils Absolute: 0 10*3/uL (ref 0.0–0.1)
Basophils Relative: 1 %
Eosinophils Absolute: 0.2 10*3/uL (ref 0.0–0.5)
Eosinophils Relative: 2 %
HCT: 41.8 % (ref 36.0–46.0)
Hemoglobin: 14.3 g/dL (ref 12.0–15.0)
Immature Granulocytes: 0 %
Lymphocytes Relative: 23 %
Lymphs Abs: 1.5 10*3/uL (ref 0.7–4.0)
MCH: 29.8 pg (ref 26.0–34.0)
MCHC: 34.2 g/dL (ref 30.0–36.0)
MCV: 87.1 fL (ref 80.0–100.0)
Monocytes Absolute: 0.3 10*3/uL (ref 0.1–1.0)
Monocytes Relative: 5 %
Neutro Abs: 4.5 10*3/uL (ref 1.7–7.7)
Neutrophils Relative %: 69 %
Platelets: 255 10*3/uL (ref 150–400)
RBC: 4.8 MIL/uL (ref 3.87–5.11)
RDW: 11.8 % (ref 11.5–15.5)
WBC: 6.5 10*3/uL (ref 4.0–10.5)
nRBC: 0 % (ref 0.0–0.2)

## 2022-02-14 LAB — LIPID PANEL
Cholesterol: 211 mg/dL — ABNORMAL HIGH (ref 0–200)
HDL: 60 mg/dL (ref >40)
LDL Cholesterol: 141 mg/dL — ABNORMAL HIGH (ref 0–99)
Total CHOL/HDL Ratio: 3.5 RATIO
Triglycerides: 49 mg/dL (ref <150)
VLDL: 10 mg/dL (ref 0–40)

## 2022-02-14 LAB — IRON AND TIBC
Iron: 138 ug/dL (ref 28–170)
Saturation Ratios: 37 % — ABNORMAL HIGH (ref 10.4–31.8)
TIBC: 371 ug/dL (ref 250–450)
UIBC: 233 ug/dL

## 2022-02-14 LAB — FERRITIN: Ferritin: 26 ng/mL (ref 11–307)

## 2022-02-14 LAB — VITAMIN D 25 HYDROXY (VIT D DEFICIENCY, FRACTURES): Vit D, 25-Hydroxy: 22.74 ng/mL — ABNORMAL LOW (ref 30–100)

## 2022-02-14 LAB — TSH: TSH: 2.001 u[IU]/mL (ref 0.350–4.500)

## 2022-02-15 LAB — HEMOGLOBIN A1C
Hgb A1c MFr Bld: 5 % (ref 4.8–5.6)
Mean Plasma Glucose: 97 mg/dL

## 2022-02-16 ENCOUNTER — Encounter: Payer: Self-pay | Admitting: Family

## 2022-02-19 ENCOUNTER — Other Ambulatory Visit: Payer: Self-pay | Admitting: Family

## 2022-02-19 DIAGNOSIS — R899 Unspecified abnormal finding in specimens from other organs, systems and tissues: Secondary | ICD-10-CM

## 2022-02-19 DIAGNOSIS — Z8639 Personal history of other endocrine, nutritional and metabolic disease: Secondary | ICD-10-CM

## 2022-02-27 DIAGNOSIS — R319 Hematuria, unspecified: Secondary | ICD-10-CM | POA: Diagnosis not present

## 2022-02-27 DIAGNOSIS — Z841 Family history of disorders of kidney and ureter: Secondary | ICD-10-CM | POA: Diagnosis not present

## 2022-03-07 ENCOUNTER — Ambulatory Visit
Admission: RE | Admit: 2022-03-07 | Discharge: 2022-03-07 | Disposition: A | Discharge status: Home / Self Care | Payer: Commercial Managed Care - PPO | Source: Ambulatory Visit | Attending: Nephrology | Admitting: Nephrology

## 2022-03-07 DIAGNOSIS — R3129 Other microscopic hematuria: Secondary | ICD-10-CM | POA: Diagnosis not present

## 2022-03-07 DIAGNOSIS — R3121 Asymptomatic microscopic hematuria: Secondary | ICD-10-CM | POA: Diagnosis not present

## 2022-04-08 DIAGNOSIS — Z1331 Encounter for screening for depression: Secondary | ICD-10-CM | POA: Diagnosis not present

## 2022-04-08 DIAGNOSIS — Z01419 Encounter for gynecological examination (general) (routine) without abnormal findings: Secondary | ICD-10-CM | POA: Diagnosis not present

## 2022-07-04 ENCOUNTER — Encounter: Payer: Self-pay | Admitting: Family

## 2022-07-04 ENCOUNTER — Other Ambulatory Visit: Payer: Self-pay | Admitting: Family

## 2022-07-04 DIAGNOSIS — R899 Unspecified abnormal finding in specimens from other organs, systems and tissues: Secondary | ICD-10-CM

## 2022-07-04 DIAGNOSIS — R790 Abnormal level of blood mineral: Secondary | ICD-10-CM

## 2022-07-11 ENCOUNTER — Other Ambulatory Visit
Admission: RE | Admit: 2022-07-11 | Discharge: 2022-07-11 | Disposition: A | Discharge status: Home / Self Care | Payer: Commercial Managed Care - PPO | Source: Ambulatory Visit | Attending: Family | Admitting: Family

## 2022-07-11 DIAGNOSIS — R899 Unspecified abnormal finding in specimens from other organs, systems and tissues: Secondary | ICD-10-CM | POA: Diagnosis not present

## 2022-07-11 DIAGNOSIS — R790 Abnormal level of blood mineral: Secondary | ICD-10-CM | POA: Insufficient documentation

## 2022-07-11 LAB — IRON AND TIBC
Iron: 84 ug/dL (ref 28–170)
Saturation Ratios: 21 % (ref 10.4–31.8)
TIBC: 399 ug/dL (ref 250–450)
UIBC: 315 ug/dL

## 2022-07-11 LAB — VITAMIN D 25 HYDROXY (VIT D DEFICIENCY, FRACTURES): Vit D, 25-Hydroxy: 30.05 ng/mL (ref 30–100)

## 2022-07-11 LAB — FERRITIN: Ferritin: 19 ng/mL (ref 11–307)

## 2022-10-21 ENCOUNTER — Other Ambulatory Visit: Payer: Self-pay | Admitting: Family

## 2022-10-21 DIAGNOSIS — Z1231 Encounter for screening mammogram for malignant neoplasm of breast: Secondary | ICD-10-CM

## 2022-10-27 DIAGNOSIS — R3121 Asymptomatic microscopic hematuria: Secondary | ICD-10-CM | POA: Diagnosis not present

## 2022-12-12 ENCOUNTER — Ambulatory Visit
Admission: RE | Admit: 2022-12-12 | Discharge: 2022-12-12 | Disposition: A | Discharge status: Home / Self Care | Payer: Commercial Managed Care - PPO | Source: Ambulatory Visit | Attending: Family | Admitting: Family

## 2022-12-12 DIAGNOSIS — Z1231 Encounter for screening mammogram for malignant neoplasm of breast: Secondary | ICD-10-CM | POA: Insufficient documentation

## 2022-12-12 IMAGING — MG DIGITAL DIAGNOSTIC BILAT W/ TOMO W/ CAD
6 of 10 series · 6 of 30 positions shown · non-contrast
Comparison: Previous exam(s).

CLINICAL DATA: Two year follow-up of a probably benign right breast
mass only seen with ultrasound. Annual mammography.

EXAM:
DIGITAL DIAGNOSTIC BILATERAL MAMMOGRAM WITH TOMOSYNTHESIS AND CAD;
ULTRASOUND RIGHT BREAST LIMITED
TECHNIQUE: Bilateral digital diagnostic mammography and breast tomosynthesis
was performed. The images were evaluated with computer-aided
detection.; Targeted ultrasound examination of the right breast was
performed

[L CC synth-2D]
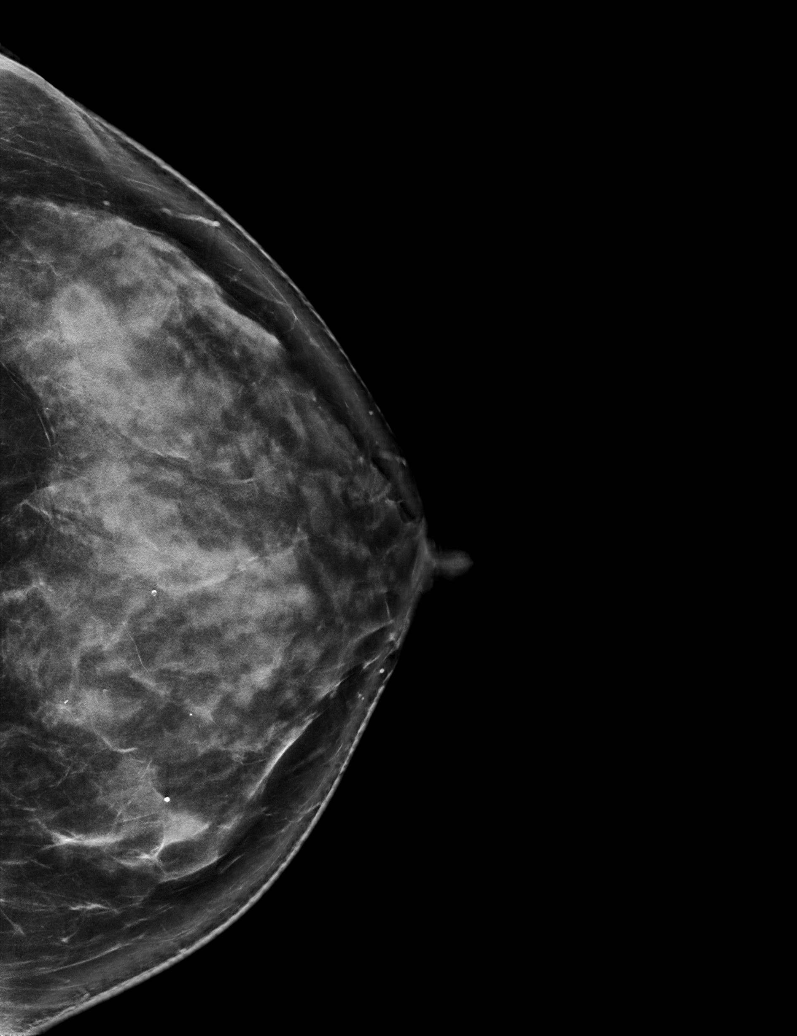

[R CC synth-2D]
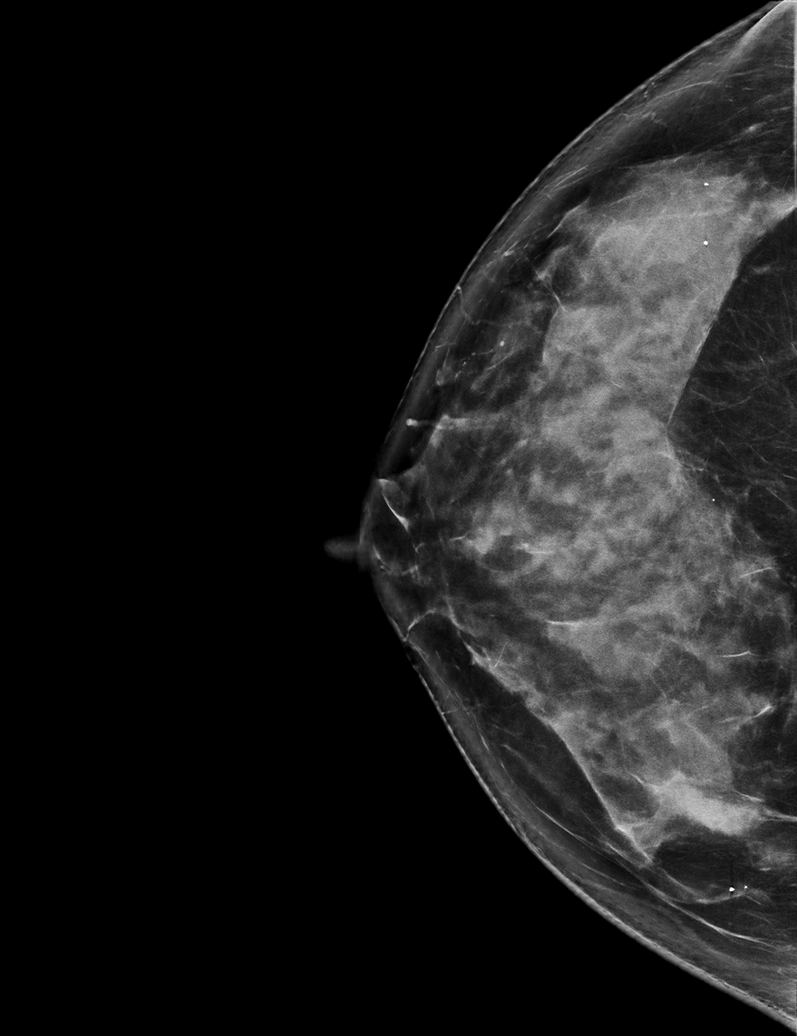

[R MLO synth-2D (1 of 2)]
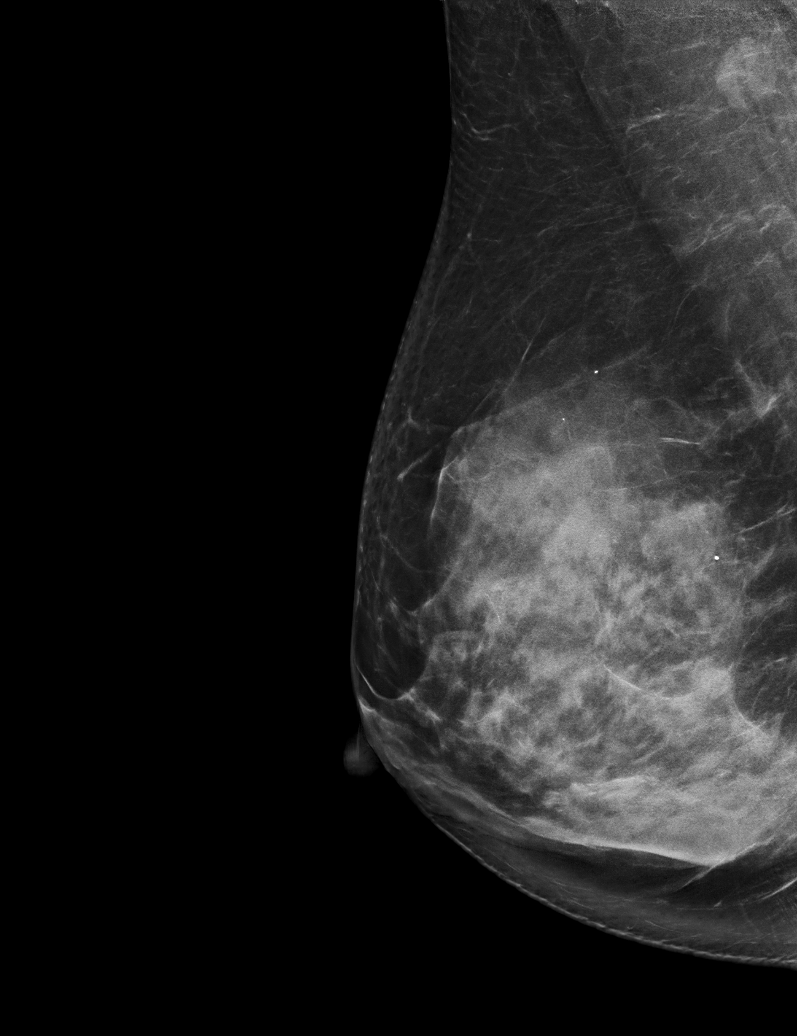

[L MLO synth-2D]
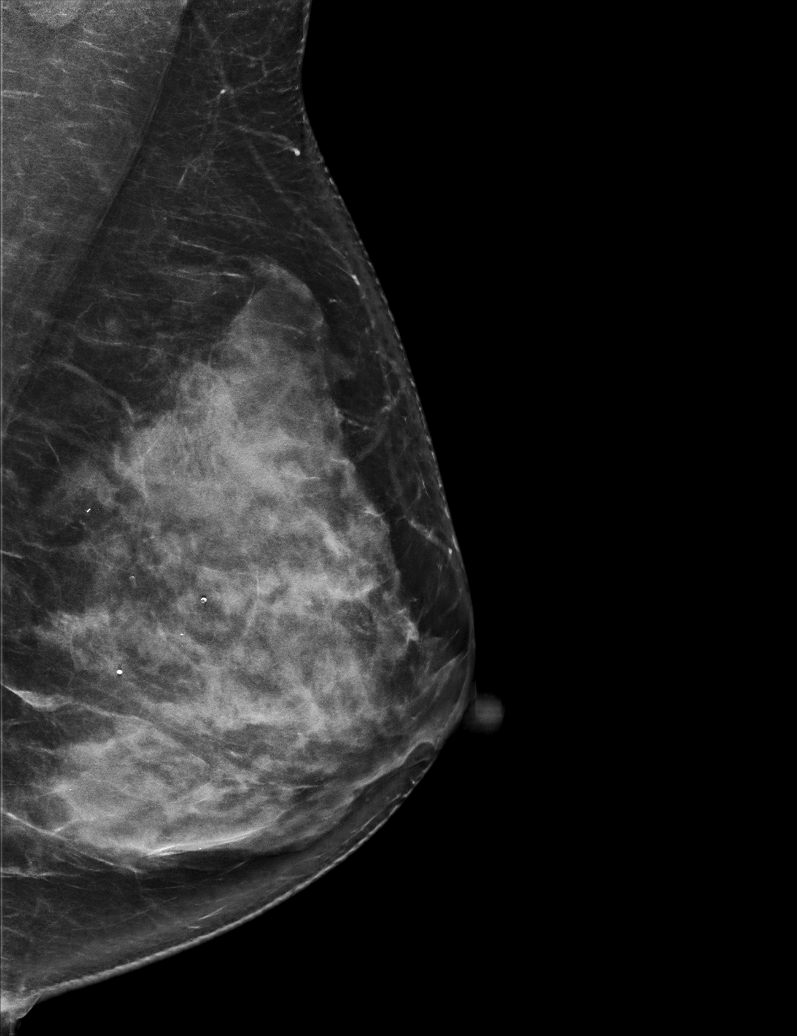

[R MLO synth-2D (2 of 2)]
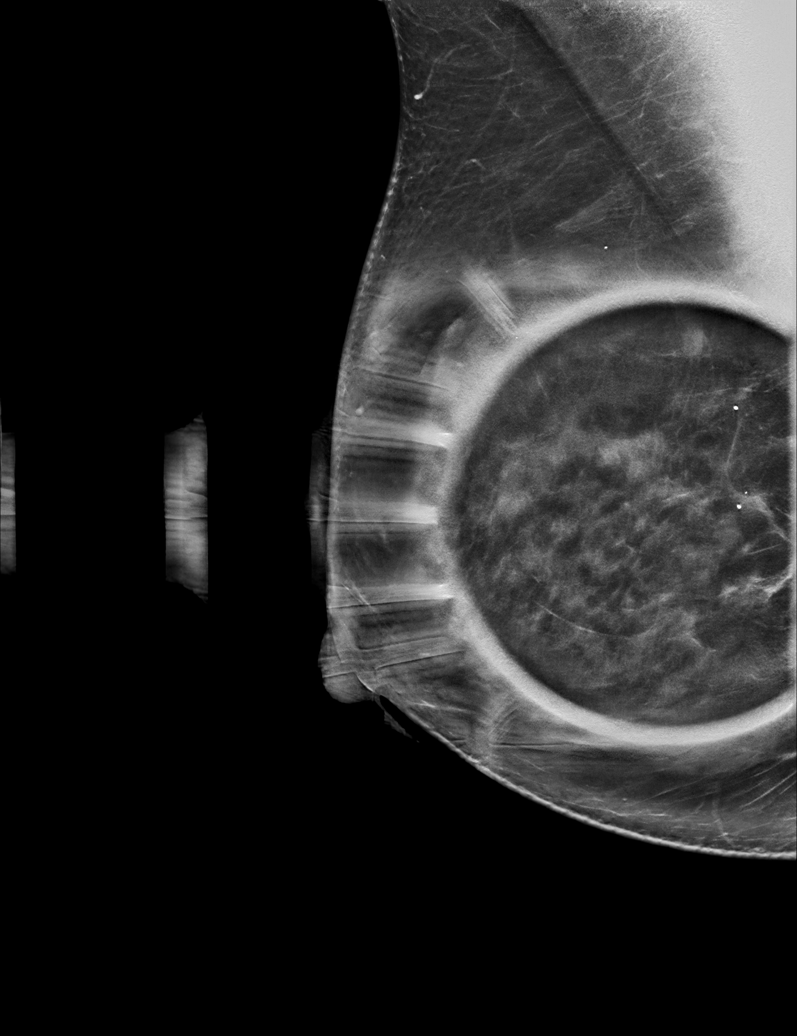

[R MLO tomo · tomo slice 41/82.0]
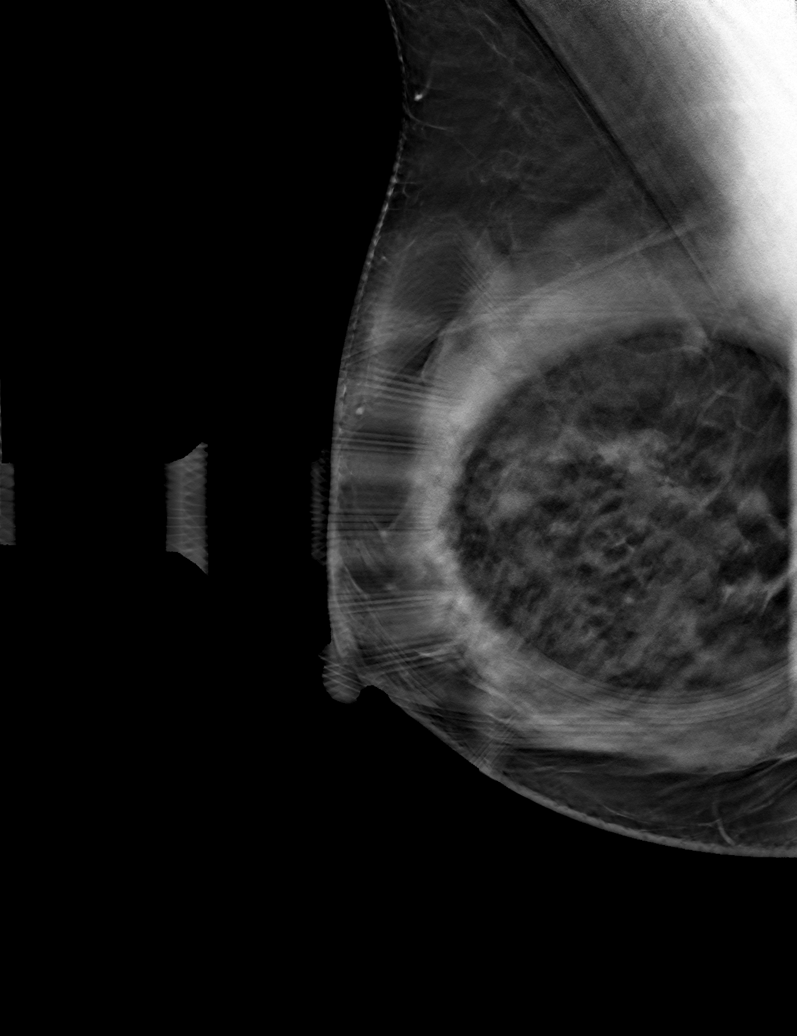

[6 of 30 positions shown; findings below may reference images not displayed]

ACR Breast Density Category c: The breast tissue is heterogeneously
dense, which may obscure small masses.
FINDINGS: An asymmetry on the right MLO view resolves into glandular tissue on
spot imaging. No other suspicious findings are seen in either
breast.

On physical exam, no suspicious lumps are identified.

Targeted ultrasound is performed, showing a stable right breast mass
at 9 o'clock, 5 cm from the nipple measuring 6 x 5 x 6 mm, unchanged
for 2 years.
IMPRESSION: Stable right breast mass at 9 o'clock, now considered benign given
lack of growth for 2 years. No evidence of malignancy in either
breast.

RECOMMENDATION:
Annual screening mammography.

I have discussed the findings and recommendations with the patient.
If applicable, a reminder letter will be sent to the patient
regarding the next appointment.

BI-RADS CATEGORY  2: Benign.

## 2022-12-16 ENCOUNTER — Other Ambulatory Visit: Payer: Self-pay | Admitting: Family

## 2022-12-16 DIAGNOSIS — R928 Other abnormal and inconclusive findings on diagnostic imaging of breast: Secondary | ICD-10-CM

## 2022-12-16 DIAGNOSIS — R921 Mammographic calcification found on diagnostic imaging of breast: Secondary | ICD-10-CM

## 2022-12-25 ENCOUNTER — Ambulatory Visit
Admission: RE | Admit: 2022-12-25 | Discharge: 2022-12-25 | Disposition: A | Discharge status: Home / Self Care | Payer: Commercial Managed Care - PPO | Source: Ambulatory Visit | Attending: Family | Admitting: Family

## 2022-12-25 DIAGNOSIS — R921 Mammographic calcification found on diagnostic imaging of breast: Secondary | ICD-10-CM | POA: Insufficient documentation

## 2022-12-25 DIAGNOSIS — R928 Other abnormal and inconclusive findings on diagnostic imaging of breast: Secondary | ICD-10-CM | POA: Diagnosis not present

## 2022-12-25 DIAGNOSIS — R92343 Mammographic extreme density, bilateral breasts: Secondary | ICD-10-CM | POA: Diagnosis not present

## 2022-12-26 ENCOUNTER — Other Ambulatory Visit: Payer: Self-pay | Admitting: Family

## 2022-12-26 DIAGNOSIS — R928 Other abnormal and inconclusive findings on diagnostic imaging of breast: Secondary | ICD-10-CM

## 2022-12-26 DIAGNOSIS — R921 Mammographic calcification found on diagnostic imaging of breast: Secondary | ICD-10-CM

## 2023-01-19 ENCOUNTER — Encounter: Payer: Self-pay | Admitting: Family

## 2023-01-19 ENCOUNTER — Other Ambulatory Visit: Payer: Self-pay

## 2023-01-19 DIAGNOSIS — Z0100 Encounter for examination of eyes and vision without abnormal findings: Secondary | ICD-10-CM

## 2023-01-19 DIAGNOSIS — Z83511 Family history of glaucoma: Secondary | ICD-10-CM

## 2023-01-19 NOTE — Telephone Encounter (Signed)
Referral is placed and t has been notified

## 2023-02-12 ENCOUNTER — Encounter: Payer: Commercial Managed Care - PPO | Admitting: Family

## 2023-02-13 ENCOUNTER — Encounter: Payer: Self-pay | Admitting: Family

## 2023-02-17 ENCOUNTER — Other Ambulatory Visit: Payer: Self-pay | Admitting: Family

## 2023-02-17 DIAGNOSIS — Z0000 Encounter for general adult medical examination without abnormal findings: Secondary | ICD-10-CM

## 2023-02-17 DIAGNOSIS — Z1211 Encounter for screening for malignant neoplasm of colon: Secondary | ICD-10-CM

## 2023-02-18 ENCOUNTER — Other Ambulatory Visit: Payer: Self-pay | Admitting: Family

## 2023-02-18 DIAGNOSIS — Z0000 Encounter for general adult medical examination without abnormal findings: Secondary | ICD-10-CM

## 2023-02-20 ENCOUNTER — Other Ambulatory Visit
Admission: RE | Admit: 2023-02-20 | Discharge: 2023-02-20 | Disposition: A | Discharge status: Home / Self Care | Payer: Commercial Managed Care - PPO | Attending: Family | Admitting: Family

## 2023-02-20 DIAGNOSIS — Z0000 Encounter for general adult medical examination without abnormal findings: Secondary | ICD-10-CM | POA: Insufficient documentation

## 2023-02-20 LAB — IRON AND TIBC
Iron: 107 ug/dL (ref 28–170)
Saturation Ratios: 26 % (ref 10.4–31.8)
TIBC: 409 ug/dL (ref 250–450)
UIBC: 302 ug/dL

## 2023-02-20 LAB — COMPREHENSIVE METABOLIC PANEL
ALT: 14 U/L (ref 0–44)
AST: 17 U/L (ref 15–41)
Albumin: 4.6 g/dL (ref 3.5–5.0)
Alkaline Phosphatase: 42 U/L (ref 38–126)
Anion gap: 9 (ref 5–15)
BUN: 10 mg/dL (ref 6–20)
CO2: 25 mmol/L (ref 22–32)
Calcium: 8.9 mg/dL (ref 8.9–10.3)
Chloride: 102 mmol/L (ref 98–111)
Creatinine, Ser: 0.68 mg/dL (ref 0.44–1.00)
GFR, Estimated: >60 mL/min (ref >60)
Glucose, Bld: 90 mg/dL (ref 70–99)
Potassium: 3.7 mmol/L (ref 3.5–5.1)
Sodium: 136 mmol/L (ref 135–145)
Total Bilirubin: 1.1 mg/dL (ref <1.2)
Total Protein: 7.4 g/dL (ref 6.5–8.1)

## 2023-02-20 LAB — CBC WITH DIFFERENTIAL/PLATELET
Abs Immature Granulocytes: 0.01 10*3/uL (ref 0.00–0.07)
Basophils Absolute: 0 10*3/uL (ref 0.0–0.1)
Basophils Relative: 1 %
Eosinophils Absolute: 0.2 10*3/uL (ref 0.0–0.5)
Eosinophils Relative: 3 %
HCT: 40.6 % (ref 36.0–46.0)
Hemoglobin: 13.9 g/dL (ref 12.0–15.0)
Immature Granulocytes: 0 %
Lymphocytes Relative: 29 %
Lymphs Abs: 1.7 10*3/uL (ref 0.7–4.0)
MCH: 30.2 pg (ref 26.0–34.0)
MCHC: 34.2 g/dL (ref 30.0–36.0)
MCV: 88.1 fL (ref 80.0–100.0)
Monocytes Absolute: 0.4 10*3/uL (ref 0.1–1.0)
Monocytes Relative: 6 %
Neutro Abs: 3.6 10*3/uL (ref 1.7–7.7)
Neutrophils Relative %: 61 %
Platelets: 260 10*3/uL (ref 150–400)
RBC: 4.61 MIL/uL (ref 3.87–5.11)
RDW: 11.8 % (ref 11.5–15.5)
WBC: 5.8 10*3/uL (ref 4.0–10.5)
nRBC: 0 % (ref 0.0–0.2)

## 2023-02-20 LAB — TSH: TSH: 3.184 u[IU]/mL (ref 0.350–4.500)

## 2023-02-20 LAB — FERRITIN: Ferritin: 30 ng/mL (ref 11–307)

## 2023-02-20 LAB — LIPID PANEL
Cholesterol: 211 mg/dL — ABNORMAL HIGH (ref 0–200)
HDL: 59 mg/dL (ref >40)
LDL Cholesterol: 138 mg/dL — ABNORMAL HIGH (ref 0–99)
Total CHOL/HDL Ratio: 3.6 {ratio}
Triglycerides: 72 mg/dL (ref <150)
VLDL: 14 mg/dL (ref 0–40)

## 2023-02-20 LAB — HEMOGLOBIN A1C
Hgb A1c MFr Bld: 4.6 % — ABNORMAL LOW (ref 4.8–5.6)
Mean Plasma Glucose: 85.32 mg/dL

## 2023-03-18 ENCOUNTER — Encounter: Payer: Self-pay | Admitting: Family

## 2023-03-18 ENCOUNTER — Ambulatory Visit (INDEPENDENT_AMBULATORY_CARE_PROVIDER_SITE_OTHER): Payer: Commercial Managed Care - PPO | Admitting: Family

## 2023-03-18 ENCOUNTER — Other Ambulatory Visit: Payer: Self-pay

## 2023-03-18 VITALS — BP 120/70 | HR 64 | Temp 98.6°F | Ht 65.0 in | Wt 175.0 lb

## 2023-03-18 DIAGNOSIS — Z0000 Encounter for general adult medical examination without abnormal findings: Secondary | ICD-10-CM | POA: Diagnosis not present

## 2023-03-18 DIAGNOSIS — J309 Allergic rhinitis, unspecified: Secondary | ICD-10-CM | POA: Diagnosis not present

## 2023-03-18 MED ORDER — AZELASTINE HCL 0.1 % NA SOLN
1.0000 | Freq: Two times a day (BID) | NASAL | 6 refills | Status: AC
  Filled 2023-03-18: qty 60, 90d supply, fill #0

## 2023-03-18 NOTE — Progress Notes (Signed)
 Assessment & Plan:  Routine physical examination Assessment & Plan: CBE performed today. Patient will schedule left breast diagnostic mammogram 06/2023.  Referral for colonoscopy has previously placed.      Allergic rhinitis, unspecified seasonality, unspecified trigger -     Azelastine  HCl; Place 1 spray into both nostrils 2 (two) times daily. Use in each nostril as directed  Dispense: 90 mL; Refill: 6     Return precautions given.   Risks, benefits, and alternatives of the medications and treatment plan prescribed today were discussed, and patient expressed understanding.   Education regarding symptom management and diagnosis given to patient on AVS either electronically or printed.  No follow-ups on file.  Bascom Bossier, FNP  Subjective:    Patient ID: Nicole Hunter, female    DOB: June 10, 1975, 49 y.o.   MRN: [id number]  CC: Nicole Hunter is a 48 y.o. female who presents today for physical exam.    HPI: Feels well today No new complaints   No recent follow up with [facility], last seen 01/2022. She plans to follow up as needed.    Colorectal Cancer Screening: due; referral placed Breast Cancer Screening: Mammogram UTD, with recommendation for left diagnostic mammogram for calcification due 06/2023, right breast mammogram 12/12/2022 without suspicious findings for malignancy  Cervical Cancer Screening: UTD,Consult Dr. Alvia Awkward January 2023, pap done 03/27/21, negative malignancy, HPV  Bone Health screening/DEXA for 65+: No increased fracture risk. Defer screening at this time.       Exercise: Some regular exercise;enjoys walking when weather is warmer.    Alcohol use:  none Smoking/tobacco use: Nonsmoker.    Health Maintenance  Topic Date Due   DTaP/Tdap/Td vaccine (1 - Tdap) Never done   Pap with HPV screening  Never done   Flu Shot  10/02/2022   Colon Cancer Screening  Never done   COVID-19 Vaccine (3 - 2024-25 season) 11/02/2022   Hepatitis C Screening   Completed   HIV Screening  Completed   HPV Vaccine  Aged Out    ALLERGIES: Penicillins  Current Outpatient Medications on File Prior to Visit  Medication Sig Dispense Refill   Cholecalciferol (VITAMIN D3) 5000 units CAPS Take by mouth daily.      cromolyn  (NASALCROM ) 5.2 MG/ACT nasal spray Place 1 spray into both nostrils 4 (four) times daily. 26 mL 12   omeprazole (PRILOSEC) 20 MG capsule Take by mouth.     No current facility-administered medications on file prior to visit.    Review of Systems  Constitutional:  Negative for chills, fever and unexpected weight change.  HENT:  Negative for congestion.   Respiratory:  Negative for cough.   Cardiovascular:  Negative for chest pain, palpitations and leg swelling.  Gastrointestinal:  Negative for nausea and vomiting.  Musculoskeletal:  Negative for arthralgias and myalgias.  Skin:  Negative for rash.  Neurological:  Negative for headaches.  Hematological:  Negative for adenopathy.  Psychiatric/Behavioral:  Negative for confusion.       Objective:    BP 120/70   Pulse 64   Temp 98.6 F (37 C) (Oral)   Ht 5\' 5"  (1.651 m)   Wt 175 lb (79.4 kg)   LMP  (LMP Unknown)   SpO2 98%   BMI 29.12 kg/m   BP Readings from Last 3 Encounters:  03/18/23 120/70  02/07/22 118/78  12/21/20 112/68   Wt Readings from Last 3 Encounters:  03/18/23 175 lb (79.4 kg)  02/07/22 168 lb 12.8 oz (76.6 kg)  12/21/20 171 lb 6.4 oz (77.7 kg)    Physical Exam Vitals reviewed.  Constitutional:      Appearance: Normal appearance. She is well-developed.  Eyes:     Conjunctiva/sclera: Conjunctivae normal.  Neck:     Thyroid : No thyroid  mass or thyromegaly.  Cardiovascular:     Rate and Rhythm: Normal rate and regular rhythm.     Pulses: Normal pulses.     Heart sounds: Normal heart sounds.  Pulmonary:     Effort: Pulmonary effort is normal.     Breath sounds: Normal breath sounds. No wheezing, rhonchi or rales.  Chest:  Breasts:    Breasts  are symmetrical.     Right: No inverted nipple, mass, nipple discharge, skin change or tenderness.     Left: No inverted nipple, mass, nipple discharge, skin change or tenderness.  Abdominal:     General: Bowel sounds are normal. There is no distension.     Palpations: Abdomen is soft. Abdomen is not rigid. There is no fluid wave or mass.     Tenderness: There is no abdominal tenderness. There is no guarding or rebound.  Lymphadenopathy:     Head:     Right side of head: No submental, submandibular, tonsillar, preauricular, posterior auricular or occipital adenopathy.     Left side of head: No submental, submandibular, tonsillar, preauricular, posterior auricular or occipital adenopathy.     Cervical: No cervical adenopathy.     Right cervical: No superficial, deep or posterior cervical adenopathy.    Left cervical: No superficial, deep or posterior cervical adenopathy.  Skin:    General: Skin is warm and dry.  Neurological:     Mental Status: She is alert.  Psychiatric:        Speech: Speech normal.        Behavior: Behavior normal.        Thought Content: Thought content normal.

## 2023-03-18 NOTE — Assessment & Plan Note (Signed)
 CBE performed today. Patient will schedule left breast diagnostic mammogram 06/2023.  Referral for colonoscopy has previously placed.

## 2023-04-02 ENCOUNTER — Other Ambulatory Visit: Payer: Self-pay

## 2023-04-02 ENCOUNTER — Encounter: Payer: Self-pay | Admitting: Family

## 2023-04-02 ENCOUNTER — Other Ambulatory Visit: Payer: Self-pay | Admitting: Family

## 2023-04-02 DIAGNOSIS — J0100 Acute maxillary sinusitis, unspecified: Secondary | ICD-10-CM

## 2023-04-02 MED ORDER — AMOXICILLIN-POT CLAVULANATE 875-125 MG PO TABS
1.0000 | ORAL_TABLET | Freq: Two times a day (BID) | ORAL | 0 refills | Status: AC
  Filled 2023-04-02: qty 14, 7d supply, fill #0

## 2023-04-10 DIAGNOSIS — H40003 Preglaucoma, unspecified, bilateral: Secondary | ICD-10-CM | POA: Diagnosis not present

## 2023-04-14 DIAGNOSIS — N393 Stress incontinence (female) (male): Secondary | ICD-10-CM | POA: Insufficient documentation

## 2023-04-14 DIAGNOSIS — Z01419 Encounter for gynecological examination (general) (routine) without abnormal findings: Secondary | ICD-10-CM | POA: Diagnosis not present

## 2023-04-14 DIAGNOSIS — H40003 Preglaucoma, unspecified, bilateral: Secondary | ICD-10-CM | POA: Insufficient documentation

## 2023-05-26 DIAGNOSIS — K6289 Other specified diseases of anus and rectum: Secondary | ICD-10-CM | POA: Diagnosis not present

## 2023-05-26 DIAGNOSIS — K621 Rectal polyp: Secondary | ICD-10-CM | POA: Diagnosis not present

## 2023-05-26 DIAGNOSIS — Z1211 Encounter for screening for malignant neoplasm of colon: Secondary | ICD-10-CM | POA: Diagnosis not present

## 2023-06-26 ENCOUNTER — Ambulatory Visit
Admission: RE | Admit: 2023-06-26 | Discharge: 2023-06-26 | Disposition: A | Discharge status: Home / Self Care | Source: Ambulatory Visit | Attending: Family | Admitting: Family

## 2023-06-26 DIAGNOSIS — R921 Mammographic calcification found on diagnostic imaging of breast: Secondary | ICD-10-CM | POA: Insufficient documentation

## 2023-06-26 DIAGNOSIS — R928 Other abnormal and inconclusive findings on diagnostic imaging of breast: Secondary | ICD-10-CM

## 2023-06-26 DIAGNOSIS — R92343 Mammographic extreme density, bilateral breasts: Secondary | ICD-10-CM | POA: Diagnosis not present

## 2023-07-04 ENCOUNTER — Other Ambulatory Visit: Payer: Self-pay | Admitting: Family

## 2023-07-04 ENCOUNTER — Encounter: Payer: Self-pay | Admitting: Family

## 2023-07-04 DIAGNOSIS — R928 Other abnormal and inconclusive findings on diagnostic imaging of breast: Secondary | ICD-10-CM

## 2023-10-09 DIAGNOSIS — H40003 Preglaucoma, unspecified, bilateral: Secondary | ICD-10-CM | POA: Diagnosis not present

## 2024-01-01 ENCOUNTER — Ambulatory Visit
Admission: RE | Admit: 2024-01-01 | Discharge: 2024-01-01 | Disposition: A | Discharge status: Home / Self Care | Source: Ambulatory Visit | Attending: Family | Admitting: Family

## 2024-01-01 DIAGNOSIS — R928 Other abnormal and inconclusive findings on diagnostic imaging of breast: Secondary | ICD-10-CM | POA: Insufficient documentation

## 2024-01-01 DIAGNOSIS — R92343 Mammographic extreme density, bilateral breasts: Secondary | ICD-10-CM | POA: Diagnosis not present

## 2024-01-01 DIAGNOSIS — R921 Mammographic calcification found on diagnostic imaging of breast: Secondary | ICD-10-CM | POA: Diagnosis not present

## 2024-02-10 ENCOUNTER — Other Ambulatory Visit: Payer: Self-pay | Admitting: Family

## 2024-02-10 DIAGNOSIS — Z13220 Encounter for screening for lipoid disorders: Secondary | ICD-10-CM

## 2024-02-10 DIAGNOSIS — Z0000 Encounter for general adult medical examination without abnormal findings: Secondary | ICD-10-CM

## 2024-02-10 DIAGNOSIS — R5383 Other fatigue: Secondary | ICD-10-CM

## 2024-02-10 DIAGNOSIS — Z8639 Personal history of other endocrine, nutritional and metabolic disease: Secondary | ICD-10-CM

## 2024-03-02 ENCOUNTER — Other Ambulatory Visit
Admission: RE | Admit: 2024-03-02 | Discharge: 2024-03-02 | Disposition: A | Discharge status: Home / Self Care | Source: Home / Self Care | Attending: Family | Admitting: Family

## 2024-03-02 DIAGNOSIS — Z13220 Encounter for screening for lipoid disorders: Secondary | ICD-10-CM | POA: Insufficient documentation

## 2024-03-02 DIAGNOSIS — R5383 Other fatigue: Secondary | ICD-10-CM | POA: Diagnosis present

## 2024-03-02 DIAGNOSIS — Z8639 Personal history of other endocrine, nutritional and metabolic disease: Secondary | ICD-10-CM | POA: Insufficient documentation

## 2024-03-02 DIAGNOSIS — Z0000 Encounter for general adult medical examination without abnormal findings: Secondary | ICD-10-CM | POA: Insufficient documentation

## 2024-03-02 DIAGNOSIS — Z136 Encounter for screening for cardiovascular disorders: Secondary | ICD-10-CM | POA: Insufficient documentation

## 2024-03-02 LAB — COMPREHENSIVE METABOLIC PANEL WITH GFR
ALT: 17 U/L (ref 0–44)
AST: 18 U/L (ref 15–41)
Albumin: 4.8 g/dL (ref 3.5–5.0)
Alkaline Phosphatase: 49 U/L (ref 38–126)
Anion gap: 12 (ref 5–15)
BUN: 12 mg/dL (ref 6–20)
CO2: 24 mmol/L (ref 22–32)
Calcium: 9.1 mg/dL (ref 8.9–10.3)
Chloride: 102 mmol/L (ref 98–111)
Creatinine, Ser: 0.63 mg/dL (ref 0.44–1.00)
GFR, Estimated: >60 mL/min
Glucose, Bld: 94 mg/dL (ref 70–99)
Potassium: 3.9 mmol/L (ref 3.5–5.1)
Sodium: 137 mmol/L (ref 135–145)
Total Bilirubin: 0.7 mg/dL (ref 0.0–1.2)
Total Protein: 7.5 g/dL (ref 6.5–8.1)

## 2024-03-02 LAB — CBC WITH DIFFERENTIAL/PLATELET
Abs Immature Granulocytes: 0.02 K/uL (ref 0.00–0.07)
Basophils Absolute: 0.1 K/uL (ref 0.0–0.1)
Basophils Relative: 1 %
Eosinophils Absolute: 0.1 K/uL (ref 0.0–0.5)
Eosinophils Relative: 2 %
HCT: 41.7 % (ref 36.0–46.0)
Hemoglobin: 14.2 g/dL (ref 12.0–15.0)
Immature Granulocytes: 0 %
Lymphocytes Relative: 29 %
Lymphs Abs: 1.8 K/uL (ref 0.7–4.0)
MCH: 29.6 pg (ref 26.0–34.0)
MCHC: 34.1 g/dL (ref 30.0–36.0)
MCV: 87.1 fL (ref 80.0–100.0)
Monocytes Absolute: 0.4 K/uL (ref 0.1–1.0)
Monocytes Relative: 6 %
Neutro Abs: 3.9 K/uL (ref 1.7–7.7)
Neutrophils Relative %: 62 %
Platelets: 253 K/uL (ref 150–400)
RBC: 4.79 MIL/uL (ref 3.87–5.11)
RDW: 11.8 % (ref 11.5–15.5)
WBC: 6.3 K/uL (ref 4.0–10.5)
nRBC: 0 % (ref 0.0–0.2)

## 2024-03-02 LAB — LIPID PANEL
Cholesterol: 225 mg/dL — ABNORMAL HIGH (ref 0–200)
HDL: 65 mg/dL
LDL Cholesterol: 143 mg/dL — ABNORMAL HIGH (ref 0–99)
Total CHOL/HDL Ratio: 3.4 ratio
Triglycerides: 85 mg/dL
VLDL: 17 mg/dL (ref 0–40)

## 2024-03-02 LAB — IRON AND TIBC
Iron: 127 ug/dL (ref 28–170)
Saturation Ratios: 31 % (ref 10.4–31.8)
TIBC: 407 ug/dL (ref 250–450)
UIBC: 280 ug/dL

## 2024-03-02 LAB — FERRITIN: Ferritin: 34 ng/mL (ref 11–307)

## 2024-03-02 LAB — VITAMIN D 25 HYDROXY (VIT D DEFICIENCY, FRACTURES): Vit D, 25-Hydroxy: 25.5 ng/mL — ABNORMAL LOW (ref 30–100)

## 2024-03-02 LAB — HEMOGLOBIN A1C
Hgb A1c MFr Bld: 5 % (ref 4.8–5.6)
Mean Plasma Glucose: 96.8 mg/dL

## 2024-03-02 LAB — TSH: TSH: 2.83 u[IU]/mL (ref 0.350–4.500)

## 2024-03-14 ENCOUNTER — Ambulatory Visit: Payer: Self-pay | Admitting: Family

## 2024-03-14 ENCOUNTER — Encounter: Payer: Self-pay | Admitting: Family

## 2024-03-30 ENCOUNTER — Ambulatory Visit: Admitting: Family

## 2024-03-30 ENCOUNTER — Other Ambulatory Visit: Payer: Self-pay

## 2024-03-30 ENCOUNTER — Encounter: Payer: Self-pay | Admitting: Family

## 2024-03-30 VITALS — BP 108/78 | HR 68 | Temp 98.5°F | Ht 65.0 in | Wt 175.6 lb

## 2024-03-30 DIAGNOSIS — Z0000 Encounter for general adult medical examination without abnormal findings: Secondary | ICD-10-CM | POA: Diagnosis not present

## 2024-03-30 DIAGNOSIS — Z23 Encounter for immunization: Secondary | ICD-10-CM | POA: Diagnosis not present

## 2024-03-30 DIAGNOSIS — E559 Vitamin D deficiency, unspecified: Secondary | ICD-10-CM | POA: Diagnosis not present

## 2024-03-30 MED ORDER — VITAMIN D (ERGOCALCIFEROL) 1.25 MG (50000 UNIT) PO CAPS
50000.0000 [IU] | ORAL_CAPSULE | ORAL | 0 refills | Status: AC
  Filled 2024-03-30: qty 8, 56d supply, fill #0

## 2024-03-30 MED ORDER — CHOLECALCIFEROL 1.25 MG (50000 UT) PO TABS
ORAL_TABLET | ORAL | 0 refills | Status: DC
  Filled 2024-03-30: qty 8, fill #0

## 2024-03-30 NOTE — Assessment & Plan Note (Addendum)
 Clinical breast exam performed today.  Deferred pelvic exam the absent complaints and she is following with [facility].  Pap smear is due this year and is scheduled per patient. Encouraged continued exercise.  Tdap provided.  Discussed elevated LDL cholesterol and obtaining a CT calcium score.  Patient currently agreed to pursue dietary changes and likely order pursue CT calcium score in her early 15s.

## 2024-03-30 NOTE — Progress Notes (Signed)
 "  Assessment & Plan:  Need for diphtheria-tetanus-pertussis (Tdap) vaccine -     Tdap vaccine greater than or equal to 47yo IM  Vitamin D  deficiency  Routine physical examination Assessment & Plan: Clinical breast exam performed today.  Deferred pelvic exam the absent complaints and she is following with [facility].  Pap smear is due this year and is scheduled per patient. Encouraged continued exercise.  Tdap provided.  Discussed elevated LDL cholesterol and obtaining a CT calcium score.  Patient currently agreed to pursue dietary changes and likely order pursue CT calcium score in her early 52s.   Other orders -     Vitamin D  (Ergocalciferol ); Take 1 capsule (50,000 Units total) by mouth every 7 (seven) days.  Dispense: 8 capsule; Refill: 0     Return precautions given.   Risks, benefits, and alternatives of the medications and treatment plan prescribed today were discussed, and patient expressed understanding.   Education regarding symptom management and diagnosis given to patient on AVS either electronically or printed.  No follow-ups on file.  Rollene Northern, FNP  Subjective:    Patient ID: Nicole Hunter, female    DOB: Aug 05, 1975, 49 y.o.   MRN: [id number]  CC: Nicole Hunter is a 49 y.o. female who presents today for physical exam.    HPI: Overall feels well today.  No new complaints.  Her oldest child is applying to college which has caused some anxiety , until they know where he is accepted this Spring.  She has coping mechanisms to deal with this additional stress.  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   elevated total cholesterol, LDL cholesterol.  In the last year her parents were living with her for 10 months and she endorses some dietary indiscretion.  She would like to repeat her cholesterol in 1 year.    Last follow up 01/13/2022 nephrology, Dr. Dennise .  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 A final diagnostic mammogram in 1 year is recommended to complete 2 years of surveillance. Cervical Cancer Screening: following with [facility] Pap smear 03/27/21 NILM, neg HPV   Bone Health screening/DEXA for 65+: No increased fracture risk. Defer screening at this time.  Lung Cancer Screening: Doesn't have 20 year pack year history and age > 1 years yo 60 years        Tetanus - due       Exercise: Gets regular exercise with jogging.  Alcohol use:  occassioanl Smoking/tobacco use: Nonsmoker.    Health Maintenance  Topic Date Due   Hepatitis B Vaccine (1 of 3 - 19+ 3-dose series) Never done   Flu Shot  10/02/2023   COVID-19 Vaccine (3 - 2025-26 season) 04/15/2024*   Breast Cancer Screening  12/31/2025   Pap with HPV screening  03/27/2026   Colon Cancer Screening  05/26/2026   DTaP/Tdap/Td vaccine (2 - Td or Tdap) 03/30/2034   HPV Vaccine (No Doses Required) Completed   Hepatitis C Screening  Completed   HIV Screening  Completed   Pneumococcal Vaccine  Aged Out   Meningitis B Vaccine  Aged Out  *Topic was postponed. The date shown is not the original due date.    ALLERGIES: Penicillins  Medications Ordered Prior  to Encounter[1]  Review of Systems  Constitutional:  Negative for chills and fever.  Respiratory:  Negative for cough.   Cardiovascular:  Negative for chest pain and palpitations.  Gastrointestinal:  Negative for nausea and vomiting.      Objective:    BP 108/78   Pulse 68   Temp 98.5 F (36.9 C) (Oral)   Ht 5' 5 (1.651 m)   Wt 175 lb 9.6 oz (79.7 kg)   SpO2 98%   BMI 29.22 kg/m   BP Readings from Last 3 Encounters:  03/30/24 108/78  03/18/23 120/70  02/07/22 118/78   Wt Readings from Last 3 Encounters:   03/30/24 175 lb 9.6 oz (79.7 kg)  03/18/23 175 lb (79.4 kg)  02/07/22 168 lb 12.8 oz (76.6 kg)    Physical Exam Vitals reviewed.  Constitutional:      Appearance: Normal appearance. She is well-developed.  Eyes:     Conjunctiva/sclera: Conjunctivae normal.  Neck:     Thyroid : No thyroid  mass or thyromegaly.  Cardiovascular:     Rate and Rhythm: Normal rate and regular rhythm.     Pulses: Normal pulses.     Heart sounds: Normal heart sounds.  Pulmonary:     Effort: Pulmonary effort is normal.     Breath sounds: Normal breath sounds. No wheezing, rhonchi or rales.  Chest:  Breasts:    Breasts are symmetrical.     Right: No inverted nipple, mass, nipple discharge, skin change or tenderness.     Left: No inverted nipple, mass, nipple discharge, skin change or tenderness.  Abdominal:     General: Bowel sounds are normal. There is no distension.     Palpations: Abdomen is soft. Abdomen is not rigid. There is no fluid wave or mass.     Tenderness: There is no abdominal tenderness. There is no guarding or rebound.  Lymphadenopathy:     Head:     Right side of head: No submental, submandibular, tonsillar, preauricular, posterior auricular or occipital adenopathy.     Left side of head: No submental, submandibular, tonsillar, preauricular, posterior auricular or occipital adenopathy.     Cervical: No cervical adenopathy.     Right cervical: No superficial, deep or posterior cervical adenopathy.    Left cervical: No superficial, deep or posterior cervical adenopathy.  Skin:    General: Skin is warm and dry.  Neurological:     Mental Status: She is alert.  Psychiatric:        Speech: Speech normal.        Behavior: Behavior normal.        Thought Content: Thought content normal.            [1]  Current Outpatient Medications on File Prior to Visit  Medication Sig Dispense Refill   azelastine  (ASTELIN ) 0.1 % nasal spray Place 1 spray into both nostrils 2 (two) times daily.  Use in each nostril as directed 90 mL 6   Cholecalciferol  (VITAMIN D3) 5000 units CAPS Take by mouth daily.      cromolyn  (NASALCROM ) 5.2 MG/ACT nasal spray Place 1 spray into both nostrils 4 (four) times daily. 26 mL 12   omeprazole (PRILOSEC) 20 MG capsule Take by mouth.     No current facility-administered medications on file prior to visit.   "

## 2024-03-30 NOTE — Patient Instructions (Signed)
 Your vitamin D  level remains low.  You may start prescription for vitamin  D 50000 units by mouth ONCE weekly for 8 weeks only. I have sent this to your pharmacy. After 8 weeks, you MAY stop this dose and resume over the counter cholecalciferol  800 units daily.  I will not refill vitamin d  50,000 units until vitamin D  is rechecked as vitamin d  is toxic if taking more than needed.   Please call our office to a follow up visit with me and we can recheck vitamin d  level in a few months.    Health Maintenance, Female Adopting a healthy lifestyle and getting preventive care are important in promoting health and wellness. Ask your health care provider about: The right schedule for you to have regular tests and exams. Things you can do on your own to prevent diseases and keep yourself healthy. What should I know about diet, weight, and exercise? Eat a healthy diet  Eat a diet that includes plenty of vegetables, fruits, low-fat dairy products, and lean protein. Do not eat a lot of foods that are high in solid fats, added sugars, or sodium. Maintain a healthy weight Body mass index (BMI) is used to identify weight problems. It estimates body fat based on height and weight. Your health care provider can help determine your BMI and help you achieve or maintain a healthy weight. Get regular exercise Get regular exercise. This is one of the most important things you can do for your health. Most adults should: Exercise for at least 150 minutes each week. The exercise should increase your heart rate and make you sweat (moderate-intensity exercise). Do strengthening exercises at least twice a week. This is in addition to the moderate-intensity exercise. Spend less time sitting. Even light physical activity can be beneficial. Watch cholesterol and blood lipids Have your blood tested for lipids and cholesterol at 47 years of age, then have this test every 5 years. Have your cholesterol levels checked more often  if: Your lipid or cholesterol levels are high. You are older than 47 years of age. You are at high risk for heart disease. What should I know about cancer screening? Depending on your health history and family history, you may need to have cancer screening at various ages. This may include screening for: Breast cancer. Cervical cancer. Colorectal cancer. Skin cancer. Lung cancer. What should I know about heart disease, diabetes, and high blood pressure? Blood pressure and heart disease High blood pressure causes heart disease and increases the risk of stroke. This is more likely to develop in people who have high blood pressure readings or are overweight. Have your blood pressure checked: Every 3-5 years if you are 32-55 years of age. Every year if you are 38 years old or older. Diabetes Have regular diabetes screenings. This checks your fasting blood sugar level. Have the screening done: Once every three years after age 63 if you are at a normal weight and have a low risk for diabetes. More often and at a younger age if you are overweight or have a high risk for diabetes. What should I know about preventing infection? Hepatitis B If you have a higher risk for hepatitis B, you should be screened for this virus. Talk with your health care provider to find out if you are at risk for hepatitis B infection. Hepatitis C Testing is recommended for: Everyone born from [birth year] through 1963. Anyone with known risk factors for hepatitis C. Sexually transmitted infections (STIs) Get screened for  STIs, including gonorrhea and chlamydia, if: You are sexually active and are younger than 47 years of age. You are older than 47 years of age and your health care provider tells you that you are at risk for this type of infection. Your sexual activity has changed since you were last screened, and you are at increased risk for chlamydia or gonorrhea. Ask your health care provider if you are at risk. Ask  your health care provider about whether you are at high risk for HIV. Your health care provider may recommend a prescription medicine to help prevent HIV infection. If you choose to take medicine to prevent HIV, you should first get tested for HIV. You should then be tested every 3 months for as long as you are taking the medicine. Pregnancy If you are about to stop having your period (premenopausal) and you may become pregnant, seek counseling before you get pregnant. Take 400 to 800 micrograms (mcg) of folic acid every day if you become pregnant. Ask for birth control (contraception) if you want to prevent pregnancy. Osteoporosis and menopause Osteoporosis is a disease in which the bones lose minerals and strength with aging. This can result in bone fractures. If you are 94 years old or older, or if you are at risk for osteoporosis and fractures, ask your health care provider if you should: Be screened for bone loss. Take a calcium or vitamin D  supplement to lower your risk of fractures. Be given hormone replacement therapy (HRT) to treat symptoms of menopause. Follow these instructions at home: Alcohol use Do not drink alcohol if: Your health care provider tells you not to drink. You are pregnant, may be pregnant, or are planning to become pregnant. If you drink alcohol: Limit how much you have to: 0-1 drink a day. Know how much alcohol is in your drink. In the [place] one drink equals one 12 oz bottle of beer (355 mL), one 5 oz glass of wine (148 mL), or one 1 oz glass of hard liquor (44 mL). Lifestyle Do not use any products that contain nicotine or tobacco. These products include cigarettes, chewing tobacco, and vaping devices, such as e-cigarettes. If you need help quitting, ask your health care provider. Do not use street drugs. Do not share needles. Ask your health care provider for help if you need support or information about quitting drugs. General instructions Schedule regular  health, dental, and eye exams. Stay current with your vaccines. Tell your health care provider if: You often feel depressed. You have ever been abused or do not feel safe at home. Summary Adopting a healthy lifestyle and getting preventive care are important in promoting health and wellness. Follow your health care provider's instructions about healthy diet, exercising, and getting tested or screened for diseases. Follow your health care provider's instructions on monitoring your cholesterol and blood pressure. This information is not intended to replace advice given to you by your health care provider. Make sure you discuss any questions you have with your health care provider. Document Revised: 07/09/2020 Document Reviewed: 07/09/2020 Elsevier Patient Education  2024 [employer].
